# Patient Record
Sex: Male | Born: 1990 | Race: Black or African American | Hispanic: No | Marital: Single | Smoking: Current every day smoker
Health system: Southern US, Community
[De-identification: ages and names within clinical notes are randomized; demographics above are authoritative.]

## PROBLEM LIST (undated history)

## (undated) DIAGNOSIS — F419 Anxiety disorder, unspecified: Secondary | ICD-10-CM

## (undated) DIAGNOSIS — F319 Bipolar disorder, unspecified: Secondary | ICD-10-CM

## (undated) DIAGNOSIS — F39 Unspecified mood [affective] disorder: Secondary | ICD-10-CM

## (undated) DIAGNOSIS — F329 Major depressive disorder, single episode, unspecified: Secondary | ICD-10-CM

## (undated) DIAGNOSIS — F32A Depression, unspecified: Secondary | ICD-10-CM

---
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
## 2012-11-16 ENCOUNTER — Emergency Department (HOSPITAL_COMMUNITY)
Admission: EM | Admit: 2012-11-16 | Discharge: 2012-11-16 | Disposition: A | Discharge status: Home / Self Care | Payer: Self-pay | Attending: Emergency Medicine | Admitting: Emergency Medicine

## 2012-11-16 ENCOUNTER — Encounter (HOSPITAL_COMMUNITY): Payer: Self-pay

## 2012-11-16 DIAGNOSIS — F329 Major depressive disorder, single episode, unspecified: Secondary | ICD-10-CM | POA: Insufficient documentation

## 2012-11-16 DIAGNOSIS — G4700 Insomnia, unspecified: Secondary | ICD-10-CM | POA: Insufficient documentation

## 2012-11-16 DIAGNOSIS — F3289 Other specified depressive episodes: Secondary | ICD-10-CM | POA: Insufficient documentation

## 2012-11-16 DIAGNOSIS — F39 Unspecified mood [affective] disorder: Secondary | ICD-10-CM | POA: Insufficient documentation

## 2012-11-16 LAB — COMPREHENSIVE METABOLIC PANEL
ALT: 21 U/L (ref 0–53)
AST: 19 U/L (ref 0–37)
CO2: 30 mEq/L (ref 19–32)
Chloride: 103 mEq/L (ref 96–112)
GFR calc non Af Amer: >90 mL/min (ref >90)
Sodium: 139 mEq/L (ref 135–145)
Total Bilirubin: 0.2 mg/dL — ABNORMAL LOW (ref 0.3–1.2)

## 2012-11-16 LAB — CBC
Hemoglobin: 14 g/dL (ref 13.0–17.0)
MCV: 93.3 fL (ref 78.0–100.0)
Platelets: 201 10*3/uL (ref 150–400)
RBC: 4.45 MIL/uL (ref 4.22–5.81)
WBC: 6 10*3/uL (ref 4.0–10.5)

## 2012-11-16 LAB — RAPID URINE DRUG SCREEN, HOSP PERFORMED
Amphetamines: NOT DETECTED
Barbiturates: NOT DETECTED
Tetrahydrocannabinol: POSITIVE — AB

## 2012-11-16 MED ORDER — MELATONIN 10 MG PO TABS: 10.0000 mg | ORAL_TABLET | Freq: Every evening | ORAL | Status: DC | PRN

## 2012-11-16 MED ORDER — DIPHENHYDRAMINE HCL 25 MG PO TABS: 25.0000 mg | ORAL_TABLET | Freq: Four times a day (QID) | ORAL | Status: DC

## 2012-11-16 NOTE — ED Notes (Signed)
Pt. States he cannot void at this time.

## 2012-11-16 NOTE — ED Notes (Signed)
Patient presents with vague complaints of insomnia. Reports that he's had "trouble sleeping since he was a kid." Notably, since 2011 while in the [employer]. Reports that he was "kicked out" the [employer] for "not sleeping." Was sent home from work yesterday because he "looked tired." Patient requested to leave work early today because his "brain just checked out." Reports feeling weak and having low energy.  No CP, SOB, dizziness, headaches, fevers, sweats or chills.  Denies any illicit drug use, ETOH or the use of prescription medications that are not prescribed to him. Denies any past medical, surgical or psych hx. Denies SI or HI.

## 2012-11-16 NOTE — ED Provider Notes (Signed)
CSN: [id number]     Arrival date & time 11/16/12  1926 History     First MD Initiated Contact with Patient 11/16/12 2018     Chief Complaint  Patient presents with  . Insomnia   HPI  History provided by the patient. Patient is a 22 year old male with no significant PMH who presents with complaints of difficulty sleeping. Patient states he has had a long history of difficulty sleeping at night. He also states that he feels "as if he has given all his happiness to the world and now he is living without it" patient states that he has had difficulty feeling motivated to go to work or to do normal activities. He still currently is working but he left work early today because he was just not feeling well. He denies any SI or HI. He has never been evaluated for any mood changes. Patient does have family close to he feels are supportive. He denies any other complaints at this time. He has not tried any medications to help him sleep at night. He denies heavy caffeine use. No other aggravating or alleviating factors. No other associated symptoms.    History reviewed. No pertinent past medical history. History reviewed. No pertinent past surgical history. No family history on file. History  Substance Use Topics  . Smoking status: Never Smoker   . Smokeless tobacco: Never Used  . Alcohol Use: No    Review of Systems  Psychiatric/Behavioral: Positive for dysphoric mood. Negative for suicidal ideas and hallucinations. The patient is not nervous/anxious and is not hyperactive.   All other systems reviewed and are negative.    Allergies  Review of patient's allergies indicates no known allergies.  Home Medications  No current outpatient prescriptions on file. BP 132/75  Pulse 64  Temp(Src) 98.6 F (37 C) (Oral)  Resp 16  SpO2 100% Physical Exam  Nursing note and vitals reviewed. Constitutional: He is oriented to person, place, and time. He appears well-developed and well-nourished. No  distress.  HENT:  Head: Normocephalic and atraumatic.  Eyes: Conjunctivae and EOM are normal. Pupils are equal, round, and reactive to light.  Cardiovascular: Normal rate and regular rhythm.   Pulmonary/Chest: Effort normal and breath sounds normal. No respiratory distress. He has no wheezes. He has no rales.  Abdominal: Soft.  Musculoskeletal: Normal range of motion.  Neurological: He is alert and oriented to person, place, and time.  Skin: Skin is warm.  Psychiatric: His behavior is normal. He exhibits a depressed mood.    ED Course   Procedures   Results for orders placed during the hospital encounter of 11/16/12  ACETAMINOPHEN LEVEL      Result Value Range   Acetaminophen (Tylenol), Serum <15.0  10 - 30 ug/mL  CBC      Result Value Range   WBC 6.0  4.0 - 10.5 K/uL   RBC 4.45  4.22 - 5.81 MIL/uL   Hemoglobin 14.0  13.0 - 17.0 g/dL   HCT 40.9  81.1 - 91.4 %   MCV 93.3  78.0 - 100.0 fL   MCH 31.5  26.0 - 34.0 pg   MCHC 33.7  30.0 - 36.0 g/dL   RDW 78.2  95.6 - 21.3 %   Platelets 201  150 - 400 K/uL  COMPREHENSIVE METABOLIC PANEL      Result Value Range   Sodium 139  135 - 145 mEq/L   Potassium 4.2  3.5 - 5.1 mEq/L   Chloride 103  96 -  112 mEq/L   CO2 30  19 - 32 mEq/L   Glucose, Bld 85  70 - 99 mg/dL   BUN 12  6 - 23 mg/dL   Creatinine, Ser 1.61  0.50 - 1.35 mg/dL   Calcium 9.5  8.4 - 09.6 mg/dL   Total Protein 7.3  6.0 - 8.3 g/dL   Albumin 4.2  3.5 - 5.2 g/dL   AST 19  0 - 37 U/L   ALT 21  0 - 53 U/L   Alkaline Phosphatase 59  39 - 117 U/L   Total Bilirubin 0.2 (*) 0.3 - 1.2 mg/dL   GFR calc non Af Amer >90  >90 mL/min   GFR calc Af Amer >90  >90 mL/min  ETHANOL      Result Value Range   Alcohol, Ethyl (B) <11  0 - 11 mg/dL  SALICYLATE LEVEL      Result Value Range   Salicylate Lvl <2.0 (*) 2.8 - 20.0 mg/dL  URINE RAPID DRUG SCREEN (HOSP PERFORMED)      Result Value Range   Opiates NONE DETECTED  NONE DETECTED   Cocaine NONE DETECTED  NONE DETECTED    Benzodiazepines NONE DETECTED  NONE DETECTED   Amphetamines NONE DETECTED  NONE DETECTED   Tetrahydrocannabinol POSITIVE (*) NONE DETECTED   Barbiturates NONE DETECTED  NONE DETECTED      1. Insomnia   2. Depression     MDM  9:00PM patient seen and evaluated. Patient appears well in no acute distress. He denies SI or HI. He is expressing some symptoms consistent with depression. This may be related to his insomnia. This time he is safe to return home without any concerns for harm to himself or others. He will be given resource guide a followup psychiatrist or PCP.  Angus Seller, PA-C 11/16/12 2249

## 2012-11-18 NOTE — ED Provider Notes (Signed)
Medical screening examination/treatment/procedure(s) were performed by non-physician practitioner and as supervising physician I was immediately available for consultation/collaboration.   Mical Kicklighter S Layson Bertsch, MD 11/18/12 1116 

## 2013-02-07 ENCOUNTER — Emergency Department (HOSPITAL_COMMUNITY)
Admission: EM | Admit: 2013-02-07 | Discharge: 2013-02-07 | Disposition: A | Discharge status: Other Acute Inpatient Hospital | Payer: Self-pay | Attending: Emergency Medicine | Admitting: Emergency Medicine

## 2013-02-07 ENCOUNTER — Inpatient Hospital Stay (HOSPITAL_COMMUNITY)
Admission: RE | Admit: 2013-02-07 | Discharge: 2013-02-15 | DRG: 885 | Disposition: A | Discharge status: Home / Self Care | Payer: Federal, State, Local not specified - Other | Attending: Psychiatry | Admitting: Psychiatry

## 2013-02-07 ENCOUNTER — Encounter (HOSPITAL_COMMUNITY): Payer: Self-pay | Admitting: Emergency Medicine

## 2013-02-07 ENCOUNTER — Encounter (HOSPITAL_COMMUNITY): Payer: Self-pay | Admitting: *Deleted

## 2013-02-07 DIAGNOSIS — Z79899 Other long term (current) drug therapy: Secondary | ICD-10-CM

## 2013-02-07 DIAGNOSIS — F39 Unspecified mood [affective] disorder: Secondary | ICD-10-CM | POA: Diagnosis present

## 2013-02-07 DIAGNOSIS — F319 Bipolar disorder, unspecified: Secondary | ICD-10-CM | POA: Diagnosis present

## 2013-02-07 DIAGNOSIS — F22 Delusional disorders: Secondary | ICD-10-CM | POA: Insufficient documentation

## 2013-02-07 HISTORY — DX: Major depressive disorder, single episode, unspecified: F32.9

## 2013-02-07 HISTORY — DX: Anxiety disorder, unspecified: F41.9

## 2013-02-07 HISTORY — DX: Depression, unspecified: F32.A

## 2013-02-07 HISTORY — DX: Unspecified mood (affective) disorder: F39

## 2013-02-07 LAB — CBC
MCH: 31.2 pg (ref 26.0–34.0)
Platelets: 263 10*3/uL (ref 150–400)
RBC: 5.26 MIL/uL (ref 4.22–5.81)
WBC: 7.7 10*3/uL (ref 4.0–10.5)

## 2013-02-07 LAB — COMPREHENSIVE METABOLIC PANEL
Albumin: 4.8 g/dL (ref 3.5–5.2)
Alkaline Phosphatase: 78 U/L (ref 39–117)
BUN: 11 mg/dL (ref 6–23)
CO2: 25 mEq/L (ref 19–32)
Chloride: 96 mEq/L (ref 96–112)
Creatinine, Ser: 0.93 mg/dL (ref 0.50–1.35)
GFR calc Af Amer: >90 mL/min (ref >90)
Glucose, Bld: 108 mg/dL — ABNORMAL HIGH (ref 70–99)
Sodium: 136 mEq/L (ref 135–145)
Total Bilirubin: 0.4 mg/dL (ref 0.3–1.2)
Total Protein: 9.1 g/dL — ABNORMAL HIGH (ref 6.0–8.3)

## 2013-02-07 LAB — RAPID URINE DRUG SCREEN, HOSP PERFORMED
Amphetamines: NOT DETECTED
Benzodiazepines: NOT DETECTED
Opiates: NOT DETECTED

## 2013-02-07 LAB — ETHANOL: Alcohol, Ethyl (B): <11 mg/dL (ref 0–11)

## 2013-02-07 MED ORDER — ACETAMINOPHEN 325 MG PO TABS: 650.0000 mg | ORAL_TABLET | ORAL | Status: DC | PRN

## 2013-02-07 MED ORDER — LORAZEPAM 1 MG PO TABS
1.0000 mg | ORAL_TABLET | Freq: Four times a day (QID) | ORAL | Status: DC | PRN
  Administered 2013-02-07 – 2013-02-09 (×2): 1 mg via ORAL
  Filled 2013-02-07 (×2): qty 1

## 2013-02-07 MED ORDER — ALUM & MAG HYDROXIDE-SIMETH 200-200-20 MG/5ML PO SUSP: 30.0000 mL | ORAL | Status: DC | PRN

## 2013-02-07 MED ORDER — NICOTINE 21 MG/24HR TD PT24: 21.0000 mg | MEDICATED_PATCH | Freq: Every day | TRANSDERMAL | Status: DC

## 2013-02-07 MED ORDER — IBUPROFEN 200 MG PO TABS: 600.0000 mg | ORAL_TABLET | Freq: Three times a day (TID) | ORAL | Status: DC | PRN

## 2013-02-07 MED ORDER — MAGNESIUM HYDROXIDE 400 MG/5ML PO SUSP: 30.0000 mL | Freq: Every day | ORAL | Status: DC | PRN

## 2013-02-07 MED ORDER — OLANZAPINE 5 MG PO TBDP
5.0000 mg | ORAL_TABLET | Freq: Four times a day (QID) | ORAL | Status: DC | PRN
  Administered 2013-02-07 – 2013-02-13 (×3): 5 mg via ORAL
  Filled 2013-02-07 (×3): qty 1

## 2013-02-07 MED ORDER — LORAZEPAM 1 MG PO TABS: 1.0000 mg | ORAL_TABLET | Freq: Three times a day (TID) | ORAL | Status: DC | PRN

## 2013-02-07 MED ORDER — ONDANSETRON HCL 4 MG PO TABS: 4.0000 mg | ORAL_TABLET | Freq: Three times a day (TID) | ORAL | Status: DC | PRN

## 2013-02-07 MED ORDER — ACETAMINOPHEN 325 MG PO TABS
650.0000 mg | ORAL_TABLET | Freq: Four times a day (QID) | ORAL | Status: DC | PRN
  Administered 2013-02-13: 650 mg via ORAL
  Filled 2013-02-07 (×2): qty 2

## 2013-02-07 MED ORDER — ZOLPIDEM TARTRATE 5 MG PO TABS: 5.0000 mg | ORAL_TABLET | Freq: Every evening | ORAL | Status: DC | PRN

## 2013-02-07 NOTE — BH Assessment (Signed)
Writer consulted with Dr. Dub Mikes regarding the patient meeting criteria for inpatient hospitalization.  Dr. Dub Mikes has accepted the patient.  Writer informed the [facility]) that the patient has been accepted.

## 2013-02-07 NOTE — BHH Counselor (Addendum)
Support paperwork signed by pt. Paperwork faxed to [facility] and originals placed in pt's chart.   Evette Cristal, [place] Assessment Counselor   Writer called [facility] [phone] to arrange transportation to [facility]. [name] at [facility] states someone will be at [facility] to transport pt soon.  Evette Cristal, [place] Assessment Counselor

## 2013-02-07 NOTE — Progress Notes (Signed)
Writer received a consult from [facility] ER.  Writer contacted the ER MD and informed them that the consult has been received.    Writer informed the ER MD (Dr. Renae Gloss) that the patient has already been assessed at [facility] and accepted by Dr. Dub Mikes pending a bed.    The [facility]) is aware that the patient has been accepted and is in need of a bed.
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
## 2013-02-07 NOTE — Progress Notes (Signed)
[facility] provided pt with a [employer] Orange Card application, highlighting [facility].  °
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
## 2013-02-07 NOTE — BHH Counselor (Signed)
Per [facility], pt has been accepted by Dr. Dub Mikes to [facility]. Writer notified Fayette Pho NP.   Evette Cristal, [place] Assessment Counselor
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
## 2013-02-07 NOTE — BH Assessment (Addendum)
Writer reviewed the patient with the [facility]) [facility].  Writer contacted [employer] so that the patient can be taken to [employer] for medical clearance.  Writer contacted the TTS counselor Idalia Needle) and the charge nurse Rushie Goltz) regarding the patient being sent to [facility].

## 2013-02-07 NOTE — ED Notes (Signed)
Report given to [facility], pt going to [facility], [facility] has been notified

## 2013-02-08 DIAGNOSIS — F319 Bipolar disorder, unspecified: Secondary | ICD-10-CM

## 2013-02-08 DIAGNOSIS — F39 Unspecified mood [affective] disorder: Secondary | ICD-10-CM | POA: Diagnosis present

## 2013-02-08 DIAGNOSIS — F22 Delusional disorders: Principal | ICD-10-CM

## 2013-02-08 MED ORDER — TRAZODONE HCL 50 MG PO TABS
50.0000 mg | ORAL_TABLET | Freq: Every evening | ORAL | Status: DC | PRN
  Administered 2013-02-09 – 2013-02-14 (×6): 50 mg via ORAL
  Filled 2013-02-08: qty 28
  Filled 2013-02-08 (×6): qty 1

## 2013-02-08 MED ORDER — CITALOPRAM HYDROBROMIDE 10 MG PO TABS
10.0000 mg | ORAL_TABLET | Freq: Every day | ORAL | Status: DC
  Administered 2013-02-09: 10 mg via ORAL
  Filled 2013-02-08 (×2): qty 1

## 2013-02-08 MED ORDER — ENSURE COMPLETE PO LIQD
237.0000 mL | Freq: Two times a day (BID) | ORAL | Status: DC
  Administered 2013-02-08 – 2013-02-15 (×14): 237 mL via ORAL

## 2013-02-08 NOTE — Progress Notes (Signed)
Adult Psychoeducational Group Note  Date:  02/08/2013 Time:  8:57 PM  Group Topic/Focus:  Wrap-Up Group:   The focus of this group is to help patients review their daily goal of treatment and discuss progress on daily workbooks.  Participation Level:  Active  Participation Quality:  Appropriate  Affect:  Appropriate  Cognitive:  Appropriate  Insight: Appropriate  Engagement in Group:  Engaged  Modes of Intervention:  Discussion  Additional Comments:The patient expressed that his day was great because he has faith in God.The patient said that more activities would make his day better because he would have more to do.  Octavio Manns 02/08/2013, 8:57 PM
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
## 2013-02-08 NOTE — Tx Team (Signed)
Initial Interdisciplinary Treatment Plan  PATIENT STRENGTHS: (choose at least two) Average or above average intelligence Communication skills Physical Health Supportive family/friends  PATIENT STRESSORS: Educational concerns Financial difficulties Legal issue Marital or family conflict Substance abuse   PROBLEM LIST: Problem List/Patient Goals Date to be addressed Date deferred Reason deferred Estimated date of resolution  Psychosis 11.3.2014     Family conflict 11.3.2014     Legal issue 11.3.2014                                          DISCHARGE CRITERIA:  Ability to meet basic life and health needs Improved stabilization in mood, thinking, and/or behavior Motivation to continue treatment in a less acute level of care Verbal commitment to aftercare and medication compliance  PRELIMINARY DISCHARGE PLAN: Return to previous living arrangement Return to previous work or school arrangements  PATIENT/FAMIILY INVOLVEMENT: This treatment plan has been presented to and reviewed with the patient, Paul Brown.  The patient and family have been given the opportunity to ask questions and make suggestions.  Cranford Mon 02/08/2013, 7:08 AM

## 2013-02-08 NOTE — ED Provider Notes (Signed)
Medical screening examination/treatment/procedure(s) were performed by non-physician practitioner and as supervising physician I was immediately available for consultation/collaboration.  EKG Interpretation   None         Gwyneth Sprout, MD 02/08/13 [phone]

## 2013-02-08 NOTE — [facility] Group Notes (Signed)
[facility] LCSW Group Therapy  02/08/2013 , 10:42 AM   Type of Therapy:  Group Therapy  Participation Level:  Active  Participation Quality:  Attentive  Affect:  Appropriate  Cognitive:  Alert  Insight:  Improving  Engagement in Therapy:  Engaged  Modes of Intervention:  Discussion, Exploration and Socialization  Summary of Progress/Problems: Today's group focused on the term Diagnosis.  Participants were asked to define the term, and then pronounce whether it is a negative, positive or neutral term.  Cynthia defined diagnosis as an educated guess, or hypothesis.  He was in and out of group a time or two, but was engaged while there.  He joined the discussion about not allowing one's diagnosis to define them, and added that one just needs enough self confidence and support to be able to ignore the hurtful comments of others.  [name] B 02/08/2013 , 10:42 AM

## 2013-02-08 NOTE — Progress Notes (Signed)
Patient ID: Paul Brown, male   DOB: 11-Sep-1990, 22 y.o.   MRN: [id number] Pt was resting in bed alone.  Denies needs/concerns.  Pt denied SI, HI and AVH.  Pt presenting upbeat mood and stated he was thinking about the "man above".  Pt was pleasant and cooperative but did not engage in much conversation.  Support and encouragement given.  Fifteen minute checks in progress. Pt safe on unit.
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
## 2013-02-08 NOTE — Progress Notes (Signed)
Patient ID: Paul Brown, male   DOB: 05/31/1990, 22 y.o.   MRN: [id number]   Pt laying in bed resting with eyes closed. Respirations even and unlabored. No distress noted.  

## 2013-02-08 NOTE — Progress Notes (Signed)
NUTRITION ASSESSMENT  Pt identified as at risk on the Malnutrition Screen Tool  INTERVENTION: 1. Educated patient on the importance of nutrition and encouraged intake of food and beverages. 2. Supplements: Ensure Complete BID  NUTRITION DIAGNOSIS: Increased nutrient needs related to excess exercise as evidenced by pt report.    Goal: Pt to meet >/= 90% of their estimated nutrition needs.  Monitor:  PO intake  Assessment:  Pt admitted with being paranoid for the past 6 months. Met with pt who reports eating well PTA, 3 meals/day, however has been exercising more than he has been eating. States he walks a lot. Thinks when he gets better, he will walk less, and then his weight will get back to normal. Weighs 108 pounds. Feels like he has been losing muscle mass recently. Interested in getting Ensure Complete during admission.   22 y.o. male  Height: Ht Readings from Last 1 Encounters:  02/07/13 5\' 5"  (1.651 m)    Weight: Wt Readings from Last 1 Encounters:  02/07/13 108 lb (48.988 kg)    Weight Hx: Wt Readings from Last 10 Encounters:  02/07/13 108 lb (48.988 kg)    BMI:  Body mass index is 17.97 kg/(m^2). Pt meets criteria for underweight based on current BMI.  Estimated Nutritional Needs: Kcal: 1500-1700 Protein: 75-95g Fluid: 1.5-1.7L/day  Diet Order: General Pt is also offered choice of unit snacks mid-morning and mid-afternoon.  Pt is eating as desired.   Lab results and medications reviewed.   Levon Hedger MS, RD, LDN [phone] Pager [phone] After Hours Pager

## 2013-02-08 NOTE — Progress Notes (Signed)
Patient ID: Paul Brown, male   DOB: 02/12/1991, 22 y.o.   MRN: [id number]   Pt laying in bed resting with eyes closed. Respirations even and unlabored. No distress noted.  

## 2013-02-08 NOTE — [facility] Suicide Risk Assessment (Signed)
Suicide Risk Assessment  Admission Assessment     Nursing information obtained from:    Demographic factors:    Current Mental Status:    Loss Factors:    Historical Factors:    Risk Reduction Factors:     CLINICAL FACTORS:   Severe Anxiety and/or Agitation Depression:   Aggression Hopelessness Impulsivity Insomnia  COGNITIVE FEATURES THAT CONTRIBUTE TO RISK:  Closed-mindedness    SUICIDE RISK:   Minimal: No identifiable suicidal ideation.  Patients presenting with no risk factors but with morbid ruminations; may be classified as minimal risk based on the severity of the depressive symptoms  PLAN OF CARE:1. Admit for crisis management and stabilization. 2. Medication management to reduce current symptoms to base line and improve the     patient's overall level of functioning 3. Treat health problems as indicated. 4. Develop treatment plan to decrease risk of relapse upon discharge and the need for     readmission. 5. Psycho-social education regarding relapse prevention and self care. 6. Health care follow up as needed for medical problems. 7. Restart home medications where appropriate.   I certify that inpatient services furnished can reasonably be expected to improve the patient's condition.  Thedore Mins, MD 02/08/2013, 11:35 AM

## 2013-02-09 DIAGNOSIS — F22 Delusional disorders: Secondary | ICD-10-CM | POA: Diagnosis present

## 2013-02-09 DIAGNOSIS — F319 Bipolar disorder, unspecified: Secondary | ICD-10-CM | POA: Diagnosis present

## 2013-02-09 MED ORDER — OLANZAPINE 5 MG PO TBDP
5.0000 mg | ORAL_TABLET | Freq: Every day | ORAL | Status: DC
  Administered 2013-02-09 – 2013-02-12 (×4): 5 mg via ORAL
  Filled 2013-02-09 (×8): qty 1

## 2013-02-09 MED ORDER — DIVALPROEX SODIUM 250 MG PO DR TAB
250.0000 mg | DELAYED_RELEASE_TABLET | Freq: Two times a day (BID) | ORAL | Status: DC
  Administered 2013-02-10 – 2013-02-12 (×5): 250 mg via ORAL
  Filled 2013-02-09 (×11): qty 1

## 2013-02-09 NOTE — Progress Notes (Signed)
[facility] MD Progress Note  02/09/2013 11:04 AM Paul Brown  MRN:  [id number] Subjective: " My mother has no right to put me in the hospital. I am ready to tell her now that I will get a basket ball scholarship and go to college of my choice." Objective: Patient with no insight into his problem who has been exercising poor judgment as evidenced by his refusal to take medications, comply with the unit rules and making  911 phone calls saying that his mother has no right to put him in the hospital. Patient has been acting bizarre with disorganized thinking. He has a fixed paranoid that some people who are out to get him have been monitoring his activities through his phone and on Instagram. He is grandiose, delusional, irritable and gets angry easily. He has been refusing to comply with his medications. Diagnosis:   DSM5: Schizophrenia Disorders:  Delusional Disorder (297.1) Obsessive-Compulsive Disorders:   Trauma-Stressor Disorders:   Substance/Addictive Disorders:   Depressive Disorders:  Disruptive Mood Dysregulation Disorder (296.99)  Axis I: Unspecified Bipolar disorder           Delusional disorder, NOS  ADL's:  Intact  Sleep: Poor  Appetite:  Fair  Suicidal Ideation: Denies  Homicidal Ideation: Denies  AEB (as evidenced by):  Psychiatric Specialty Exam: Review of Systems  Constitutional: Negative.   HENT: Negative.   Eyes: Negative.   Respiratory: Negative.   Cardiovascular: Negative.   Gastrointestinal: Negative.   Genitourinary: Negative.   Musculoskeletal: Negative.   Skin: Negative.   Neurological: Negative.   Endo/Heme/Allergies: Negative.   Psychiatric/Behavioral: The patient is nervous/anxious.     Blood pressure 133/81, pulse 93, temperature 97.7 F (36.5 C), temperature source Oral, resp. rate 20, height 5\' 5"  (1.651 m), weight 48.988 kg (108 lb).Body mass index is 17.97 kg/(m^2).  General Appearance: Fairly Groomed  Patent attorney::  Minimal  Speech:   Pressured  Volume:  Increased  Mood:  Angry and Irritable  Affect:  Labile and Full Range  Thought Process:  Circumstantial  Orientation:  Full (Time, Place, and Person)  Thought Content:  Delusions  Suicidal Thoughts:  No  Homicidal Thoughts:  No  Memory:  Immediate;   Fair Recent;   Fair Remote;   Fair  Judgement:  Poor  Insight:  Lacking  Psychomotor Activity:  Increased  Concentration:  Poor  Recall:  Fair  Akathisia:  No  Handed:  Right  AIMS (if indicated):     Assets:  Physical Health  Sleep:  Number of Hours: 4.75   Current Medications: Current Facility-Administered Medications  Medication Dose Route Frequency Provider Last Rate Last Dose  . acetaminophen (TYLENOL) tablet 650 mg  650 mg Oral Q6H PRN Rachael Fee, MD      . alum & mag hydroxide-simeth (MAALOX/MYLANTA) 200-200-20 MG/5ML suspension 30 mL  30 mL Oral Q4H PRN Rachael Fee, MD      . divalproex (DEPAKOTE) DR tablet 250 mg  250 mg Oral BID PC Cobin Cadavid      . feeding supplement (ENSURE COMPLETE) (ENSURE COMPLETE) liquid 237 mL  237 mL Oral BID BM Lavena Bullion, RD   237 mL at 02/08/13 1956  . LORazepam (ATIVAN) tablet 1 mg  1 mg Oral Q6H PRN Rachael Fee, MD   1 mg at 02/07/13 1504  . magnesium hydroxide (MILK OF MAGNESIA) suspension 30 mL  30 mL Oral Daily PRN Rachael Fee, MD      . OLANZapine zydis [facility])  disintegrating tablet 5 mg  5 mg Oral Q6H PRN Rachael Fee, MD   5 mg at 02/07/13 1504  . OLANZapine zydis (ZYPREXA) disintegrating tablet 5 mg  5 mg Oral QHS Mechele Kittleson      . traZODone (DESYREL) tablet 50 mg  50 mg Oral QHS PRN,MR X 1 Fransisca Kaufmann, NP        Lab Results:  Results for orders placed during the hospital encounter of 02/07/13 (from the past 48 hour(s))  CBC     Status: None   Collection Time    02/07/13 11:24 AM      Result Value Range   WBC 7.7  4.0 - 10.5 K/uL   RBC 5.26  4.22 - 5.81 MIL/uL   Hemoglobin 16.4  13.0 - 17.0 g/dL   HCT 16.1  09.6 - 04.5 %   MCV 91.3   78.0 - 100.0 fL   MCH 31.2  26.0 - 34.0 pg   MCHC 34.2  30.0 - 36.0 g/dL   RDW 40.9  81.1 - 91.4 %   Platelets 263  150 - 400 K/uL  COMPREHENSIVE METABOLIC PANEL     Status: Abnormal   Collection Time    02/07/13 11:24 AM      Result Value Range   Sodium 136  135 - 145 mEq/L   Potassium 3.5  3.5 - 5.1 mEq/L   Chloride 96  96 - 112 mEq/L   CO2 25  19 - 32 mEq/L   Glucose, Bld 108 (*) 70 - 99 mg/dL   BUN 11  6 - 23 mg/dL   Creatinine, Ser 7.82  0.50 - 1.35 mg/dL   Calcium 95.6 (*) 8.4 - 10.5 mg/dL   Total Protein 9.1 (*) 6.0 - 8.3 g/dL   Albumin 4.8  3.5 - 5.2 g/dL   AST 30  0 - 37 U/L   ALT 25  0 - 53 U/L   Alkaline Phosphatase 78  39 - 117 U/L   Total Bilirubin 0.4  0.3 - 1.2 mg/dL   GFR calc non Af Amer >90  >90 mL/min   GFR calc Af Amer >90  >90 mL/min   Comment: (NOTE)     The eGFR has been calculated using the CKD EPI equation.     This calculation has not been validated in all clinical situations.     eGFR's persistently <90 mL/min signify possible Chronic Kidney     Disease.  ETHANOL     Status: None   Collection Time    02/07/13 11:24 AM      Result Value Range   Alcohol, Ethyl (B) <11  0 - 11 mg/dL   Comment:            LOWEST DETECTABLE LIMIT FOR     SERUM ALCOHOL IS 11 mg/dL     FOR MEDICAL PURPOSES ONLY  URINE RAPID DRUG SCREEN (HOSP PERFORMED)     Status: None   Collection Time    02/07/13 11:40 AM      Result Value Range   Opiates NONE DETECTED  NONE DETECTED   Cocaine NONE DETECTED  NONE DETECTED   Benzodiazepines NONE DETECTED  NONE DETECTED   Amphetamines NONE DETECTED  NONE DETECTED   Tetrahydrocannabinol NONE DETECTED  NONE DETECTED   Barbiturates NONE DETECTED  NONE DETECTED   Comment:            DRUG SCREEN FOR MEDICAL PURPOSES     ONLY.  IF CONFIRMATION IS  NEEDED     FOR ANY PURPOSE, NOTIFY LAB     WITHIN 5 DAYS.                LOWEST DETECTABLE LIMITS     FOR URINE DRUG SCREEN     Drug Class       Cutoff (ng/mL)     Amphetamine       1000     Barbiturate      200     Benzodiazepine   200     Tricyclics       300     Opiates          300     Cocaine          300     THC              50    Physical Findings: AIMS: Facial and Oral Movements Muscles of Facial Expression: None, normal Lips and Perioral Area: None, normal Jaw: None, normal Tongue: None, normal,Extremity Movements Upper (arms, wrists, hands, fingers): None, normal Lower (legs, knees, ankles, toes): None, normal, Trunk Movements Neck, shoulders, hips: None, normal, Overall Severity Severity of abnormal movements (highest score from questions above): None, normal Incapacitation due to abnormal movements: None, normal Patient's awareness of abnormal movements (rate only patient's report): No Awareness, Dental Status Current problems with teeth and/or dentures?: No Does patient usually wear dentures?: No  CIWA:    COWS:     Treatment Plan Summary: Daily contact with patient to assess and evaluate symptoms and progress in treatment Medication management  Plan:1. Admit for crisis management and stabilization. 2. Medication management to reduce current symptoms to base line and improve the     patient's overall level of functioning 3. Treat health problems as indicated. 4. Develop treatment plan to decrease risk of relapse upon discharge and the need for     readmission. 5. Psycho-social education regarding relapse prevention and self care. 6. Health care follow up as needed for medical problems. 7. Restart home medications where appropriate.   Medical Decision Making Problem Points:  Established problem, worsening (2), Review of last therapy session (1) and Review of psycho-social stressors (1) Data Points:  Order Aims Assessment (2) Review of medication regiment & side effects (2) Review of new medications or change in dosage (2)  I certify that inpatient services furnished can reasonably be expected to improve the patient's condition.    Thedore Mins, MD 02/09/2013, 11:04 AM

## 2013-02-09 NOTE — Progress Notes (Signed)
Adult Services Patient-Family Contact/Session  Attendees:    Goal(s):    Safety Concerns:    Narrative:    Barrier(s):    Interventions:  Spoke to mother by phone.  She had come this AM to pick pt up after he called to say he was being released.  Advised her to contact me or nursing staff if he calls again so she can verify.  Went over meds with her and prognosis [poor.]  Asked her if she would be OK with forced meds if it came to that, and she concurred.  Recommendation(s):    Follow-up Required:  No  Explanation:    Ida Rogue 02/09/2013, 5:40 PM

## 2013-02-09 NOTE — Progress Notes (Signed)
Recreation Therapy Notes  Date: 11.05.2014 Time: 9:30am Location: [address] Dayroom  Group Topic: Communication  Goal Area(s) Addresses:  Patient will effectively communicate with peers in group.  Patient will verbalize benefit of healthy communication. Patient will verbalize positive effect of healthy communication on post d/c goals.   Behavioral Response: Did not attend.   Marykay Lex Cova Knieriem, LRT/CTRS  Lurleen Soltero L 02/09/2013 1:11 PM

## 2013-02-09 NOTE — Progress Notes (Signed)
Patient ID: Paul Brown, male   DOB: [birth year], 22 y.o.   MRN: [id number]   Pt laying in bed resting with eyes closed. Respirations even and unlabored. No distress noted.

## 2013-02-09 NOTE — Progress Notes (Signed)
D: Patient denies SI/HI and A/V hallucinations; patient reports sleep is well; reports appetite is good; reports energy level is hyper ; reports ability to pay attention is improving; rates depression as 0/10; rates hopelessness 0/10; patient reports that he has no depression and hopelessness and states " Im not depressed or hopeless, Im Blessed"  A: Monitored q 15 minutes; patient encouraged to attend groups; patient educated about medications; patient given medications per physician orders; patient encouraged to express feelings and/or concerns  R: Patient constantly talking about his rights and says that he is a [place] citizens and that he name was forged; patient states that he wants to leave because he wants to go to college; patient is religiously preoccupied, grandiose, and sometimes is having random conversations; with patient's interaction with staff and peers is inappropriate and can be redirected after several attempts; patient refuses to take certain medications because he states " Im not taking narcotics"  patient did take his citalopram this morning but he refused the by mouth Zyprexa; patient is not attending any groups and patient reports " Im not dysfunctional so I don't need to go"

## 2013-02-09 NOTE — BHH Group Notes (Signed)
[facility] LCSW Aftercare Discharge Planning Group Note   02/09/2013 9:41 AM  Participation Quality:  Paul Brown opened the group door after we had all assembled and announced he was leaving.  Of course that sent several members into a tizzy, and the process came to a grinding halt.  I told him he was not leaving and refocused the group.  He came back, and began laughing at another patient's comments.  I dismissed him.  He left without incident.    [name] B

## 2013-02-09 NOTE — Progress Notes (Signed)
Patient ID: Paul Brown, male   DOB: 1991/02/03, 22 y.o.   MRN: [id number]  D: Pt informed the writer that he'd had a "meltdown". Stated, "I freaked out yo".  Stated that once he calmed down, he realized what he'd done. Stated, "that meltdown was out of character".  Pt stated he's ready for discharge. Plans to go to school and participate in school athletics.   A:  Support and encouragement was offered. 15 min checks continued for safety.  R: Pt remains safe.

## 2013-02-09 NOTE — BHH Group Notes (Signed)
[facility] Group Therapy  02/09/2013 , 1:26 PM    Type of Therapy:  [employer] Presentation  Participation Level:  Did not attend  Summary of Progress/Problems:  Onalee Hua from [employer] came to present his recovery story and play the guitar.    [name] B 02/09/2013 , 1:26 PM

## 2013-02-10 NOTE — Progress Notes (Signed)
Adult Psychoeducational Group Note  Date:  02/10/2013 Time:  8:00PM Group Topic/Focus:  Wrap-Up Group:   The focus of this group is to help patients review their daily goal of treatment and discuss progress on daily workbooks.  Participation Level:  Active  Participation Quality:  Appropriate and Attentive  Affect:  Appropriate  Cognitive:  Alert and Appropriate  Insight: Appropriate  Engagement in Group:  Engaged  Modes of Intervention:  Discussion  Additional Comments:  Pt. Was attentive and appropriate during karaoke.    Paul Brown 02/10/2013, 10:09 PM

## 2013-02-10 NOTE — Progress Notes (Signed)
Patient ID: Paul Brown, male   DOB: July 12, 1990, 22 y.o.   MRN: [id number] D: patient met with treatment team and expressed his disappointment in his mother.  Patient stated, "She is holding me back from who I want to be."  Patient expressed his desire to go "[employer]" and play "[employer] one basketball."  Patient states that mom want him to go to "[employer]", however, he wants to finish his college education.  He reports that he went to [employer] for two years and was put on academic probation because of his grades.  He has recently been attending [employer].  Patient stated, "I'm going to put in a transfer for [employer]  Patient presented with bright affect.  He was joking during meeting with doctor, and at times acting goofy.  Patient has poor insight and judgement.  He denies any SI/HI/AVH.  A:  Continue to encourage and support patient.  Redirect when inappropriate behavior is exhibited.  Safety checks completed every 15 minutes per protocol.  R:  Patient's behavior has been appropriate.

## 2013-02-10 NOTE — B[facility] Group Notes (Signed)
[facility] Group Notes:  (Counselor/Nursing/MHT/Case Management/Adjunct)  02/10/2013 1:15PM  Type of Therapy:  Group Therapy  Participation Level:  Active  Participation Quality:  Appropriate  Affect:  Flat  Cognitive:  Oriented  Insight:  Improving  Engagement in Group:  Limited  Engagement in Therapy:  Limited  Modes of Intervention:  Discussion, Exploration and Socialization  Summary of Progress/Problems: The topic for group was balance in life.  Pt participated in the discussion about when their life was in balance and out of balance and how this feels.  Pt discussed ways to get back in balance and short term goals they can work on to get where they want to be.  Paul Brown shared that he is balanced, and knows this because he feels good today.  To help make himself feel good, he has been doing exercises here, eating well and laughing.  He also likes to walk outside.  He also talked about the relief he experiences from getting and being organized.  When everything is in order, he is in a better place mentally and emotionally.  We used his examples to talk about self-discipline as a group.   Paul Brown B 02/10/2013 3:06 PM

## 2013-02-10 NOTE — Progress Notes (Signed)
Patient ID: Paul Brown, male   DOB: 12/19/1990, 22 y.o.   MRN: [id number] [facility] MD Progress Note  02/10/2013 10:26 AM Paul Brown  MRN:  [id number] Subjective: " I keep getting upset because my mama does not want me to succeed, she wants me to go to [employer] instead of going to college, I want to be a successful educated young African American." Objective: Patient with decreased mood swings, agitation and irritability.  His thinking process is more organized today, however, he remains grandiose and paranoid. He started taking medication last night and denies any adverse reactions Diagnosis:   DSM5: Schizophrenia Disorders:  Delusional Disorder (297.1) Obsessive-Compulsive Disorders:   Trauma-Stressor Disorders:   Substance/Addictive Disorders:   Depressive Disorders:  Disruptive Mood Dysregulation Disorder (296.99)  Axis I: Unspecified Bipolar disorder           Delusional disorder, NOS  ADL's:  Intact  Sleep: fair  Appetite:  Fair  Suicidal Ideation: Denies  Homicidal Ideation: Denies  AEB (as evidenced by):  Psychiatric Specialty Exam: Review of Systems  Constitutional: Negative.   HENT: Negative.   Eyes: Negative.   Respiratory: Negative.   Cardiovascular: Negative.   Gastrointestinal: Negative.   Genitourinary: Negative.   Musculoskeletal: Negative.   Skin: Negative.   Neurological: Negative.   Endo/Heme/Allergies: Negative.   Psychiatric/Behavioral: The patient is nervous/anxious.     Blood pressure 131/91, pulse 137, temperature 97.6 F (36.4 C), temperature source Oral, resp. rate 20, height 5\' 5"  (1.651 m), weight 48.988 kg (108 lb).Body mass index is 17.97 kg/(m^2).  General Appearance: Fairly Groomed  Patent attorney::  Minimal  Speech:  Pressured  Volume:  Increased  Mood:  Angry and Irritable  Affect:  Labile and Full Range  Thought Process:  Circumstantial  Orientation:  Full (Time, Place, and Person)  Thought Content:  Delusions  Suicidal  Thoughts:  No  Homicidal Thoughts:  No  Memory:  Immediate;   Fair Recent;   Fair Remote;   Fair  Judgement:  Poor  Insight:  Lacking  Psychomotor Activity:  Increased  Concentration:  Poor  Recall:  Fair  Akathisia:  No  Handed:  Right  AIMS (if indicated):     Assets:  Physical Health  Sleep:  Number of Hours: 5   Current Medications: Current Facility-Administered Medications  Medication Dose Route Frequency Provider Last Rate Last Dose  . acetaminophen (TYLENOL) tablet 650 mg  650 mg Oral Q6H PRN Rachael Fee, MD      . alum & mag hydroxide-simeth (MAALOX/MYLANTA) 200-200-20 MG/5ML suspension 30 mL  30 mL Oral Q4H PRN Rachael Fee, MD      . divalproex (DEPAKOTE) DR tablet 250 mg  250 mg Oral BID PC Yesika Rispoli   250 mg at 02/10/13 0803  . feeding supplement (ENSURE COMPLETE) (ENSURE COMPLETE) liquid 237 mL  237 mL Oral BID BM Lavena Bullion, RD   237 mL at 02/10/13 0804  . LORazepam (ATIVAN) tablet 1 mg  1 mg Oral Q6H PRN Rachael Fee, MD   1 mg at 02/09/13 2351  . magnesium hydroxide (MILK OF MAGNESIA) suspension 30 mL  30 mL Oral Daily PRN Rachael Fee, MD      . OLANZapine zydis (ZYPREXA) disintegrating tablet 5 mg  5 mg Oral Q6H PRN Rachael Fee, MD   5 mg at 02/07/13 1504  . OLANZapine zydis (ZYPREXA) disintegrating tablet 5 mg  5 mg Oral QHS Gay Moncivais   5 mg  at 02/09/13 2225  . traZODone (DESYREL) tablet 50 mg  50 mg Oral QHS PRN,MR X 1 Fransisca Kaufmann, NP   50 mg at 02/09/13 2351    Lab Results:  No results found for this or any previous visit (from the past 48 hour(s)).  Physical Findings: AIMS: Facial and Oral Movements Muscles of Facial Expression: None, normal Lips and Perioral Area: None, normal Jaw: None, normal Tongue: None, normal,Extremity Movements Upper (arms, wrists, hands, fingers): None, normal Lower (legs, knees, ankles, toes): None, normal, Trunk Movements Neck, shoulders, hips: None, normal, Overall Severity Severity of abnormal  movements (highest score from questions above): None, normal Incapacitation due to abnormal movements: None, normal Patient's awareness of abnormal movements (rate only patient's report): No Awareness, Dental Status Current problems with teeth and/or dentures?: No Does patient usually wear dentures?: No  CIWA:    COWS:     Treatment Plan Summary: Daily contact with patient to assess and evaluate symptoms and progress in treatment Medication management  Plan:1. Admit for crisis management and stabilization. 2. Medication management to reduce current symptoms to base line and improve the  patient's overall level of functioning:     Depakote 250mg  po bid for mood lability and Zyprexa 5mg  po Qhs for delusions. 3. Treat health problems as indicated. 4. Develop treatment plan to decrease risk of relapse upon discharge and the need for     readmission. 5. Psycho-social education regarding relapse prevention and self care. 6. Health care follow up as needed for medical problems.  Medical Decision Making Problem Points:  Established problem, slight improvement (1), Review of last therapy session (1) and Review of psycho-social stressors (1) Data Points:  Order Aims Assessment (2) Review of medication regiment & side effects (2) Review of new medications or change in dosage (2)  I certify that inpatient services furnished can reasonably be expected to improve the patient's condition.   Thedore Mins, MD 02/10/2013, 10:26 AM

## 2013-02-11 NOTE — Progress Notes (Signed)
D: Pt denies SI/HI/AVH . Pt is pleasant and cooperative. Pt continues to be delusional, guarded , and has no insight to his situation. Pt stated he was going to play sports for [employer], writer explained to the pt that he needs to understand what things are achievable and what things are not. Writer told pt that with his current size, age and not playing in high school that his chances were highly unlikely to play sports at [employer] and pt needs to focus on more attainable goals.   A: Pt was offered support and encouragement. Pt was given scheduled medications. Pt was encourage to attend groups. Q 15 minute checks were done for safety.   R:Pt attends groups and interacts well with peers and staff. Pt is taking medication.Pt receptive to treatment and safety maintained on unit.

## 2013-02-11 NOTE — Progress Notes (Signed)
Patient ID: Paul Brown, male   DOB: Jul 22, 1990, 22 y.o.   MRN: [id number] [facility] MD Progress Note  02/11/2013 10:19 AM Paul Brown  MRN:  [id number] Subjective: " I am less irritable or agitated today."   Objective: Patient reports decreased anxiety, irritability and mood lability. He has been acting less bizarre and cooperating with the staffs. But,  he remains grandiose and paranoid. He has been compliant with his medications and has not endorsed any any adverse reactions.  Diagnosis:   DSM5: Schizophrenia Disorders:  Delusional Disorder (297.1) Obsessive-Compulsive Disorders:   Trauma-Stressor Disorders:   Substance/Addictive Disorders:   Depressive Disorders:  Disruptive Mood Dysregulation Disorder (296.99)  Axis I: Unspecified Bipolar disorder           Delusional disorder, NOS  ADL's:  Intact  Sleep: fair  Appetite:  Fair  Suicidal Ideation: Denies  Homicidal Ideation: Denies  AEB (as evidenced by):  Psychiatric Specialty Exam: Review of Systems  Constitutional: Negative.   HENT: Negative.   Eyes: Negative.   Respiratory: Negative.   Cardiovascular: Negative.   Gastrointestinal: Negative.   Genitourinary: Negative.   Musculoskeletal: Negative.   Skin: Negative.   Neurological: Negative.   Endo/Heme/Allergies: Negative.   Psychiatric/Behavioral: The patient is nervous/anxious.     Blood pressure 131/91, pulse 137, temperature 97.6 F (36.4 C), temperature source Oral, resp. rate 20, height 5\' 5"  (1.651 m), weight 48.988 kg (108 lb).Body mass index is 17.97 kg/(m^2).  General Appearance: Fairly Groomed  Patent attorney::  Minimal  Speech:  Pressured  Volume:  Increased  Mood:  Angry and Irritable  Affect:  Labile and Full Range  Thought Process:  Circumstantial  Orientation:  Full (Time, Place, and Person)  Thought Content:  Delusions  Suicidal Thoughts:  No  Homicidal Thoughts:  No  Memory:  Immediate;   Fair Recent;   Fair Remote;   Fair  Judgement:   Poor  Insight:  Lacking  Psychomotor Activity:  Increased  Concentration:  Poor  Recall:  Fair  Akathisia:  No  Handed:  Right  AIMS (if indicated):     Assets:  Physical Health  Sleep:  Number of Hours: 6.5   Current Medications: Current Facility-Administered Medications  Medication Dose Route Frequency Provider Last Rate Last Dose  . acetaminophen (TYLENOL) tablet 650 mg  650 mg Oral Q6H PRN Rachael Fee, MD      . alum & mag hydroxide-simeth (MAALOX/MYLANTA) 200-200-20 MG/5ML suspension 30 mL  30 mL Oral Q4H PRN Rachael Fee, MD      . divalproex (DEPAKOTE) DR tablet 250 mg  250 mg Oral BID PC Peggy Loge   250 mg at 02/11/13 0800  . feeding supplement (ENSURE COMPLETE) (ENSURE COMPLETE) liquid 237 mL  237 mL Oral BID BM Lavena Bullion, RD   237 mL at 02/10/13 2132  . LORazepam (ATIVAN) tablet 1 mg  1 mg Oral Q6H PRN Rachael Fee, MD   1 mg at 02/09/13 2351  . magnesium hydroxide (MILK OF MAGNESIA) suspension 30 mL  30 mL Oral Daily PRN Rachael Fee, MD      . OLANZapine zydis (ZYPREXA) disintegrating tablet 5 mg  5 mg Oral Q6H PRN Rachael Fee, MD   5 mg at 02/07/13 1504  . OLANZapine zydis (ZYPREXA) disintegrating tablet 5 mg  5 mg Oral QHS Elmore Hyslop   5 mg at 02/10/13 2132  . traZODone (DESYREL) tablet 50 mg  50 mg Oral QHS PRN,MR X 1  Fransisca Kaufmann, NP   50 mg at 02/10/13 2132    Lab Results:  No results found for this or any previous visit (from the past 48 hour(s)).  Physical Findings: AIMS: Facial and Oral Movements Muscles of Facial Expression: None, normal Lips and Perioral Area: None, normal Jaw: None, normal Tongue: None, normal,Extremity Movements Upper (arms, wrists, hands, fingers): None, normal Lower (legs, knees, ankles, toes): None, normal, Trunk Movements Neck, shoulders, hips: None, normal, Overall Severity Severity of abnormal movements (highest score from questions above): None, normal Incapacitation due to abnormal movements: None,  normal Patient's awareness of abnormal movements (rate only patient's report): No Awareness, Dental Status Current problems with teeth and/or dentures?: No Does patient usually wear dentures?: No  CIWA:    COWS:     Treatment Plan Summary: Daily contact with patient to assess and evaluate symptoms and progress in treatment Medication management  Plan:1. Admit for crisis management and stabilization. 2. Medication management to reduce current symptoms to base line and improve the  patient's overall level of functioning:     Continue  Depakote 250mg  po bid for mood lability and Zyprexa 5mg  po Qhs for delusions. 3. Treat health problems as indicated. 4. Develop treatment plan to decrease risk of relapse upon discharge and the need for     readmission. 5. Psycho-social education regarding relapse prevention and self care. 6. Health care follow up as needed for medical problems.  Medical Decision Making Problem Points:  Established problem, slight improvement (1), Review of last therapy session (1) and Review of psycho-social stressors (1) Data Points:  Order Aims Assessment (2) Review of medication regiment & side effects (2) Review of new medications or change in dosage (2)  I certify that inpatient services furnished can reasonably be expected to improve the patient's condition.   Thedore Mins, MD 02/11/2013, 10:19 AM

## 2013-02-11 NOTE — [facility] Group Notes (Signed)
[facility] LCSW Aftercare Discharge Planning Group Note   02/11/2013 3:44 PM  Participation Quality:  Active     Mood/Affect:  Appropriate  Depression Rating:  1  Anxiety Rating:  Denies  Thoughts of Suicide:  No Will you contract for safety?   NA  Current AVH:  No  Plan for Discharge/Comments:  Tejas presented with congruent thoughts and improved insight.  [place] talked to his grandfather yesterday, and with his grandfather's advice, Janes stated that he needs to follow the doctor's order and take his medication.  He stated "I realized that you're the one that has the certification.  I don't."     Transportation Means: family  Supports:  Family   Simona Huh

## 2013-02-11 NOTE — Tx Team (Signed)
  Interdisciplinary Treatment Plan Update   Date Reviewed:  02/11/2013  Time Reviewed:  3:39 PM  Progress in Treatment:   Attending groups: Yes Participating in groups: Yes Taking medication as prescribed: Yes  Tolerating medication: Yes Family/Significant other contact made: Yes  Patient understands diagnosis: Yes  Discussing patient identified problems/goals with staff: Yes Medical problems stabilized or resolved: Yes Denies suicidal/homicidal ideation: Yes Patient has not harmed self or others: Yes  For review of initial/current patient goals, please see plan of care.  Estimated Length of Stay:  3-5 days  Reason for Continuation of Hospitalization: Medication stabilization Other; describe Mood lability  New Problems/Goals identified:  N/A  Discharge Plan or Barriers:   return home, follow up outpt  Additional Comments:  Paul Brown has been progressing since he was willing to take medication starting yesterday.   He apologized for past outbursts and has been participating in the milieu.  Attendees:  Signature: Thedore Mins, MD 02/11/2013 3:39 PM   Signature: Richelle Ito, LCSW 02/11/2013 3:39 PM  Signature: Fransisca Kaufmann, NP 02/11/2013 3:39 PM  Signature: Joslyn Devon, RN 02/11/2013 3:39 PM  Signature: Liborio Nixon, RN 02/11/2013 3:39 PM  Signature:  02/11/2013 3:39 PM  Signature:   02/11/2013 3:39 PM  Signature:    Signature:    Signature:    Signature:    Signature:    Signature:      Scribe for Treatment Team:   Richelle Ito, LCSW  02/11/2013 3:39 PM

## 2013-02-11 NOTE — Progress Notes (Signed)
Recreation Therapy Notes  Date: 11.07.2014 Time: 9:30am Location: [address] Dayroom  Group Topic: Leisure Education  Goal Area(s) Addresses:  Patient will verbalize benefit of leisure. Patient will identify positive emotions associated with leisure.  Behavioral Response: Did not attend.   Hansika Leaming L Ramonia Mcclaran, LRT/CTRS  Crestina Strike L 02/11/2013 10:11 AM 

## 2013-02-11 NOTE — Progress Notes (Signed)
D: Patient denies SI/HI and A/V hallucinations; patient reports sleep is well; reports appetite is good; reports energy level is high ; reports ability to pay attention is improving; rates depression as 1/10; rates hopelessness 1/10; rates anxiety as 0/10; patient reports that he has no pain;   A: Monitored q 15 minutes; patient encouraged to attend groups; patient educated about medications; patient given medications per physician orders; patient encouraged to express feelings and/or concerns  R: Patient is calmer and cooperative;patient is not waiting by the door and constantly repeating "Im just waiting to leave;patient does still have his grandiose ideas and objectives but he is polite; patient's interaction with staff and peers is appropriate and minimal;  patient is taking medications as prescribed and tolerating medications; patient is not attending groups and has been sleep most of the day

## 2013-02-11 NOTE — Progress Notes (Signed)
Pt gave verbal permission to place mother's name in consent book to be able to have information on pt. Pt's mother's name was placed in book.

## 2013-02-11 NOTE — [facility] Group Notes (Signed)
[facility] LCSW Group Therapy  02/11/2013 3:57 PM  Type of Therapy:  Group Therapy   Participation Level:  Did not attend.    Summary of Progress/Problems:  Chaplain was here to lead a group on the theme of community.   Simona Huh   02/11/2013  3:57 PM

## 2013-02-12 MED ORDER — DIVALPROEX SODIUM 500 MG PO DR TAB
500.0000 mg | DELAYED_RELEASE_TABLET | Freq: Two times a day (BID) | ORAL | Status: DC
  Administered 2013-02-12 – 2013-02-15 (×7): 500 mg via ORAL
  Filled 2013-02-12 (×6): qty 1
  Filled 2013-02-12: qty 28
  Filled 2013-02-12: qty 1
  Filled 2013-02-12: qty 28
  Filled 2013-02-12: qty 1

## 2013-02-12 NOTE — Progress Notes (Signed)
Pt. Mother called inquiring about pts stay and how his night was. Writer expressed to mother the current status of the pt.

## 2013-02-12 NOTE — Progress Notes (Signed)
D:pt. In room. Pt. Stated that today was a good day. Pt. Stated in order to keep his mind off of negative stuff he works out in his room doing pushups, situps, and jogging back and forth. Pt is very euphoric and animated when talking about his future plans about going to [employer] and getting a degree in criminal justice. Denies SI/HI/AVH. Denies having any pain. Denies feeling depressed, hoplessness, or helpless. A: support and encouragement offered. q 15 min safety checks. scheduled and prn medications given R: pt remains safe on the unit

## 2013-02-12 NOTE — Progress Notes (Signed)
Patient ID: Paul Brown, male   DOB: May 30, 1990, 22 y.o.   MRN: [id number] D. Patient presents with euphoric, grandiose mood. Patient states to writer in am, '' I'm just fabulous, I'm doing fine. I slept great and I feel great thank you.I'm just taking my happy pills like you want me.  '' Patient with pressured speech at times, restless on the unit. Pt completed self inventory and rates depression at 1/10 on depression scale, 1 being least depressed and 10 being worst. Patient with somewhat disorganized thoughts , as when answering any physical problems patient states '' Nope! Just just exercising and shower. '' And then in response to changes to take better care of self patient reports : '' take my medication, listen to family, go to college major in criminal justice and psychology and play sports and get scholarships. '' A. Discussed above pt information with Earlene Plater NP. Support and encouragement provided. Medications given as ordered. R. Patient is able to be verbally redirected at this time. Will continue to monitor q 15 minutes for safety.

## 2013-02-12 NOTE — Progress Notes (Signed)
Pt. In room staring out the window, " star gazing". Pt stated that it was a really good day today. Pt stated he played basketball, did push ups, and crunches, drank a lot of water, did everything he could to be healthy. Pt. Stated that his grandfather came and saw him today, and he liked seeing him. Pt stated that he is close with his grandfather and likes talking to him. Writer asked pt if he would mind if his mother came and saw him, the pt stated he wouldn't mind seeing her, it would not bother him. Pt. Denies SI/HI/AVH. Denies having any pain. Pt is very grandiose and euphoric. Denies having any hoplessness, helplessness, or depression A: q 15 min safety checks. 1:1 time with pt. Support and encouragement given. scheduled and prn meds given R: pt. Remains safe on unit.

## 2013-02-12 NOTE — [facility] Group Notes (Signed)
[facility] Group Notes:  (Nursing/MHT/Case Management/Adjunct)  Date:  02/12/2013  Time:  10:52 AM  Type of Therapy:  Psychoeducational Skills  Participation Level:  Active  Participation Quality:  Inattentive and Redirectable  Affect:  Excited and Labile  Cognitive:  Disorganized  Insight:  Lacking  Engagement in Group:  Lacking, tangential  Modes of Intervention:  Discussion, Education and Exploration  Summary of Progress/Problems: healthy coping skills, self inventory review with RN.   Malva Limes 02/12/2013, 10:52 AM

## 2013-02-12 NOTE — Progress Notes (Signed)
Adult Psychoeducational Group Note  Date:  02/12/2013 Time:  8:52 PM  Group Topic/Focus:  Wrap-Up Group:   The focus of this group is to help patients review their daily goal of treatment and discuss progress on daily workbooks.  Participation Level:  Active  Participation Quality:  Appropriate  Affect:  Appropriate  Cognitive:  Appropriate  Insight: Appropriate  Engagement in Group:  Engaged  Modes of Intervention:  Discussion  Additional Comments: The patient said that he needs to follow the schedule and that he would do a better .The patient also thank the staff for his treatment.  Paul Brown 02/12/2013, 8:52 PM

## 2013-02-12 NOTE — Progress Notes (Signed)
Patient ID: Paul Brown, male   DOB: 1990/08/24, 22 y.o.   MRN: [id number]  [facility] MD Progress Note  02/12/2013 1:47 PM JULIS HAUBNER  MRN:  [id number]  Subjective: Patient states "I just staying in my room so I don't have any infractions that will keep me here longer. I stay in here doing pilates and exercising."  Objective: Patient observed in bed after lunch with the covers pulled up around his head. The patient greatly minimizes all psychiatric symptoms that resulted in his admission. Nursing staff report that the patient remains grandiose, delusional and is observed with manic behavior on the unit.  The patient refers to his medications as "happy pills" that he feels taking will help him leave the hospital sooner.   Diagnosis:   DSM5: Schizophrenia Disorders:  Delusional Disorder (297.1) Obsessive-Compulsive Disorders:   Trauma-Stressor Disorders:   Substance/Addictive Disorders:   Depressive Disorders:  Disruptive Mood Dysregulation Disorder (296.99)  Axis I: Unspecified Bipolar disorder           Delusional disorder, NOS  ADL's:  Intact  Sleep: fair  Appetite:  Fair  Suicidal Ideation: Denies  Homicidal Ideation: Denies  AEB (as evidenced by):  Psychiatric Specialty Exam: Review of Systems  Constitutional: Negative.   HENT: Negative.   Eyes: Negative.   Respiratory: Negative.   Cardiovascular: Negative.   Gastrointestinal: Negative.   Genitourinary: Negative.   Musculoskeletal: Negative.   Skin: Negative.   Neurological: Negative.   Endo/Heme/Allergies: Negative.   Psychiatric/Behavioral: Negative for depression and suicidal ideas. The patient is nervous/anxious.     Blood pressure 142/96, pulse 98, temperature 97.8 F (36.6 C), temperature source Oral, resp. rate 18, height 5\' 5"  (1.651 m), weight 48.988 kg (108 lb).Body mass index is 17.97 kg/(m^2).  General Appearance: Fairly Groomed  Patent attorney::  Minimal  Speech:  Pressured  Volume:  Increased   Mood:  Angry and Irritable  Affect:  Labile and Full Range  Thought Process:  Circumstantial  Orientation:  Full (Time, Place, and Person)  Thought Content:  Delusions  Suicidal Thoughts:  No  Homicidal Thoughts:  No  Memory:  Immediate;   Fair Recent;   Fair Remote;   Fair  Judgement:  Poor  Insight:  Lacking  Psychomotor Activity:  Increased  Concentration:  Poor  Recall:  Fair  Akathisia:  No  Handed:  Right  AIMS (if indicated):     Assets:  Physical Health  Sleep:  Number of Hours: 6.25   Current Medications: Current Facility-Administered Medications  Medication Dose Route Frequency Provider Last Rate Last Dose  . acetaminophen (TYLENOL) tablet 650 mg  650 mg Oral Q6H PRN Rachael Fee, MD      . alum & mag hydroxide-simeth (MAALOX/MYLANTA) 200-200-20 MG/5ML suspension 30 mL  30 mL Oral Q4H PRN Rachael Fee, MD      . divalproex (DEPAKOTE) DR tablet 500 mg  500 mg Oral BID PC Fransisca Kaufmann, NP      . feeding supplement (ENSURE COMPLETE) (ENSURE COMPLETE) liquid 237 mL  237 mL Oral BID BM Lavena Bullion, RD   237 mL at 02/11/13 1955  . LORazepam (ATIVAN) tablet 1 mg  1 mg Oral Q6H PRN Rachael Fee, MD   1 mg at 02/09/13 2351  . magnesium hydroxide (MILK OF MAGNESIA) suspension 30 mL  30 mL Oral Daily PRN Rachael Fee, MD      . OLANZapine zydis (ZYPREXA) disintegrating tablet 5 mg  5 mg Oral Q6H  PRN Rachael Fee, MD   5 mg at 02/12/13 0757  . OLANZapine zydis (ZYPREXA) disintegrating tablet 5 mg  5 mg Oral QHS Mojeed Akintayo   5 mg at 02/11/13 2114  . traZODone (DESYREL) tablet 50 mg  50 mg Oral QHS PRN,MR X 1 Fransisca Kaufmann, NP   50 mg at 02/11/13 2114    Lab Results:  No results found for this or any previous visit (from the past 48 hour(s)).  Physical Findings: AIMS: Facial and Oral Movements Muscles of Facial Expression: None, normal Lips and Perioral Area: None, normal Jaw: None, normal Tongue: None, normal,Extremity Movements Upper (arms, wrists, hands,  fingers): None, normal Lower (legs, knees, ankles, toes): None, normal, Trunk Movements Neck, shoulders, hips: None, normal, Overall Severity Severity of abnormal movements (highest score from questions above): None, normal Incapacitation due to abnormal movements: None, normal Patient's awareness of abnormal movements (rate only patient's report): No Awareness, Dental Status Current problems with teeth and/or dentures?: No Does patient usually wear dentures?: No  CIWA:    COWS:     Treatment Plan Summary: Daily contact with patient to assess and evaluate symptoms and progress in treatment Medication management  Plan:1. Continue crisis management and stabilization. 2. Medication management to reduce current symptoms to base line and improve the  patient's overall level of functioning: Increase Depakote 500 mg po bid for mood lability and Zyprexa 5mg  po Qhs for delusions. 3. Treat health problems as indicated. 4. Develop treatment plan to decrease risk of relapse upon discharge and the need for readmission. Encouraged the patient to participate in unit activities.  5. Psycho-social education regarding relapse prevention and self care. 6. Health care follow up as needed for medical problems.  Medical Decision Making Problem Points:  Established problem, slight improvement (1), Review of last therapy session (1) and Review of psycho-social stressors (1) Data Points:  Order Aims Assessment (2) Review of medication regiment & side effects (2) Review of new medications or change in dosage (2)  I certify that inpatient services furnished can reasonably be expected to improve the patient's condition.   Fransisca Kaufmann, NP-C 02/12/2013, 1:47 PM  Reviewed the information documented and agree with the treatment plan.  Lissy Deuser,JANARDHAHA R. 02/13/2013 3:29 PM

## 2013-02-13 MED ORDER — OLANZAPINE 10 MG PO TBDP
10.0000 mg | ORAL_TABLET | Freq: Every day | ORAL | Status: DC
  Administered 2013-02-13 – 2013-02-14 (×2): 10 mg via ORAL
  Filled 2013-02-13 (×2): qty 1
  Filled 2013-02-13: qty 14
  Filled 2013-02-13 (×2): qty 1

## 2013-02-13 NOTE — Progress Notes (Signed)
Patient ID: Paul Brown, male   DOB: Aug 12, 1990, 23 y.o.   MRN: [id number] D. The patient continues to present as euphoric and grandiose. His speech is still somewhat pressured, but improving. Very little insight regarding his treatment or plan of recovery. Remains focused on grandiose, delusional goals for school. Although he spent time in the dayroom interacting in the milieu, he continues to spend a great deal of his time exercising in his room. A. Met with patient 1:1 to assess. Encouraged to interact in milieu and attend evening group. Administered HS medication. R. Attended and participated in evening group. Compliant with medication.

## 2013-02-13 NOTE — Progress Notes (Signed)
Patient ID: Paul Brown, male   DOB: [birth year], 22 y.o.   MRN: [id number] Psychoeducational Group Note  Date:  02/13/2013 Time:  1515pm  Group Topic/Focus:  Making Healthy Choices:   The focus of this group is to help patients identify negative/unhealthy choices they were using prior to admission and identify positive/healthier coping strategies to replace them upon discharge.  Participation Level:  Did Not Attend  Participation Quality:    Affect: Cognitive:  Insight:  Engagement in Group:  Additional Comments:  Psychoeducational group   Valente David 02/13/2013,4:29 PM

## 2013-02-13 NOTE — [facility] Group Notes (Signed)
Adult Psychoeducational Group Note  Date:  02/13/2013 Time:  9:11 PM  Group Topic/Focus:  Wrap-Up Group:   The focus of this group is to help patients review their daily goal of treatment and discuss progress on daily workbooks.  Participation Level:  Active  Participation Quality:  Appropriate  Affect:  Appropriate  Cognitive:  Appropriate  Insight: Appropriate  Engagement in Group:  Engaged  Modes of Intervention: Discussion  Additional Comments:  Nayan expressed that he liked the music therapy group and that he was on cruise control most of the day.  He also stated that his support system was the [employer], family and God.  Caroll Rancher A 02/13/2013, 9:11 PM

## 2013-02-13 NOTE — [facility] Group Notes (Signed)
[facility] Group Notes:  (Clinical Social Work)  02/13/2013   11:15am-12:00pm  Summary of Progress/Problems:  The main focus of today's process group was to listen to a variety of genres of music and to identify that different types of music provoke different responses.  The patient then was able to identify personally what was soothing for them, as well as energizing.  Handouts were used to record feelings evoked, as well as how patient can personally use this knowledge in sleep habits, with depression, and with other symptoms.  The patient expressed understanding of concepts, as well as knowledge of how each type of music affected them and how this can be used when they are at home as a tool in their recovery.  Type of Therapy:  Music Therapy   Participation Level:  Active  Participation Quality:  Attentive and Sharing  Affect:  Blunted  Cognitive:  Oriented  Insight:  Engaged  Engagement in Therapy:  Engaged  Modes of Intervention:   Activity, Exploration  Ovadia Lopp Grossman-Orr, LCSW 02/13/2013, 12:30pm    

## 2013-02-13 NOTE — Progress Notes (Signed)
Patient ID: Paul Brown, male   DOB: [birth year], 22 y.o.   MRN: [id number]  [facility] MD Progress Note  02/13/2013 12:46 PM Paul Brown  MRN:  [id number] Subjective: Patient states "I'm doing great! I'm just having trouble focusing on projects I need to do for school."  Objective: Patient remains manic, grandiose, and demonstrates euphoric mood. His speech is pressured at times. He continues to report exercising in his room and moves the beds around to different locations in his room. Patient continues to minimize the symptoms that led to his admission and instead talks about grandiose life goals.   Diagnosis:   DSM5: Schizophrenia Disorders:  Delusional Disorder (297.1) Obsessive-Compulsive Disorders:   Trauma-Stressor Disorders:   Substance/Addictive Disorders:   Depressive Disorders:  Disruptive Mood Dysregulation Disorder (296.99)  Axis I: Unspecified Bipolar disorder           Delusional disorder, NOS  ADL's:  Intact  Sleep: Good  Appetite:  Fair  Suicidal Ideation: Denies  Homicidal Ideation: Denies  AEB (as evidenced by):  Psychiatric Specialty Exam: Review of Systems  Constitutional: Negative.   HENT: Negative.   Eyes: Negative.   Respiratory: Negative.   Cardiovascular: Negative.   Gastrointestinal: Negative.   Genitourinary: Negative.   Musculoskeletal: Negative.   Skin: Negative.   Neurological: Negative.   Endo/Heme/Allergies: Negative.   Psychiatric/Behavioral: Negative for depression, suicidal ideas and memory loss. The patient is nervous/anxious. The patient does not have insomnia.     Blood pressure 140/85, pulse 101, temperature 97.8 F (36.6 C), temperature source Oral, resp. rate 20, height 5\' 5"  (1.651 m), weight 48.988 kg (108 lb).Body mass index is 17.97 kg/(m^2).  General Appearance: Fairly Groomed  Patent attorney::  Minimal  Speech:  Pressured  Volume:  Increased  Mood:  Angry and Irritable  Affect:  Labile and Full Range  Thought Process:   Circumstantial  Orientation:  Full (Time, Place, and Person)  Thought Content:  Delusions  Suicidal Thoughts:  No  Homicidal Thoughts:  No  Memory:  Immediate;   Fair Recent;   Fair Remote;   Fair  Judgement:  Poor  Insight:  Lacking  Psychomotor Activity:  Increased  Concentration:  Poor  Recall:  Fair  Akathisia:  No  Handed:  Right  AIMS (if indicated):     Assets:  Physical Health  Sleep:  Number of Hours: 6.75   Current Medications: Current Facility-Administered Medications  Medication Dose Route Frequency Provider Last Rate Last Dose  . acetaminophen (TYLENOL) tablet 650 mg  650 mg Oral Q6H PRN Rachael Fee, MD   650 mg at 02/13/13 1046  . alum & mag hydroxide-simeth (MAALOX/MYLANTA) 200-200-20 MG/5ML suspension 30 mL  30 mL Oral Q4H PRN Rachael Fee, MD      . divalproex (DEPAKOTE) DR tablet 500 mg  500 mg Oral BID PC Fransisca Kaufmann, NP   500 mg at 02/13/13 0751  . feeding supplement (ENSURE COMPLETE) (ENSURE COMPLETE) liquid 237 mL  237 mL Oral BID BM Lavena Bullion, RD   237 mL at 02/12/13 1943  . LORazepam (ATIVAN) tablet 1 mg  1 mg Oral Q6H PRN Rachael Fee, MD   1 mg at 02/09/13 2351  . magnesium hydroxide (MILK OF MAGNESIA) suspension 30 mL  30 mL Oral Daily PRN Rachael Fee, MD      . OLANZapine zydis (ZYPREXA) disintegrating tablet 5 mg  5 mg Oral Q6H PRN Rachael Fee, MD   5 mg at 02/13/13  4540  . OLANZapine zydis (ZYPREXA) disintegrating tablet 5 mg  5 mg Oral QHS Mojeed Akintayo   5 mg at 02/12/13 2103  . traZODone (DESYREL) tablet 50 mg  50 mg Oral QHS PRN,MR X 1 Fransisca Kaufmann, NP   50 mg at 02/12/13 2103    Lab Results:  No results found for this or any previous visit (from the past 48 hour(s)).  Physical Findings: AIMS: Facial and Oral Movements Muscles of Facial Expression: None, normal Lips and Perioral Area: None, normal Jaw: None, normal Tongue: None, normal,Extremity Movements Upper (arms, wrists, hands, fingers): None, normal Lower (legs, knees,  ankles, toes): None, normal, Trunk Movements Neck, shoulders, hips: None, normal, Overall Severity Severity of abnormal movements (highest score from questions above): None, normal Incapacitation due to abnormal movements: None, normal Patient's awareness of abnormal movements (rate only patient's report): No Awareness, Dental Status Current problems with teeth and/or dentures?: No Does patient usually wear dentures?: No  CIWA:    COWS:     Treatment Plan Summary: Daily contact with patient to assess and evaluate symptoms and progress in treatment Medication management  Plan:1. Continue crisis management and stabilization. 2. Medication management to reduce current symptoms to base line and improve the  patient's overall level of functioning: Continue Depakote 500 mg po bid for mood lability and increase Zyprexa to 10 mg po Qhs for delusions. 3. Treat health problems as indicated. 4. Develop treatment plan to decrease risk of relapse upon discharge and the need for readmission. Encouraged the patient to participate in unit activities.  5. Psycho-social education regarding relapse prevention and self care. 6. Health care follow up as needed for medical problems.  Medical Decision Making Problem Points:  Established problem, slight improvement (1), Review of last therapy session (1) and Review of psycho-social stressors (1) Data Points:  Order Aims Assessment (2) Review of medication regiment & side effects (2) Review of new medications or change in dosage (2)  I certify that inpatient services furnished can reasonably be expected to improve the patient's condition.   Fransisca Kaufmann, NP-C 02/13/2013, 12:46 PM  Reviewed the information documented and agree with the treatment plan.  Holly Pring,JANARDHAHA R. 02/13/2013 3:37 PM

## 2013-02-13 NOTE — Progress Notes (Signed)
Patient ID: ISAIR INABINET, male   DOB: 1990-05-13, 22 y.o.   MRN: [id number]  D. Patient continues to  present with euphoric, grandiose mood . Noted patient has moved  bedroom furniture both beds moved to opposite side of room, to opposite corner. Patient states '' I got bored last night before going to bed so I moved my beds so I could see out the window. I'm doing great. I'm staying positive.'' Patient with pressured speech at times, restless on the unit. Pt completed self inventory and rates depression at 1/10 on depression scale, 1 being least depressed and 10 being worst, and continues to endorse his energy levels as high. A. Support and encouragement provided. Medications given as ordered. Pt compliant with medications.  R. Patient is able to be verbally redirected at this time. In no acute distress. Pt denies SI/HI. Will continue to monitor q 15 minutes for safety.

## 2013-02-13 NOTE — Progress Notes (Signed)
Patient ID: Paul Brown, male   DOB: May 18, 1990, 22 y.o.   MRN: [id number] D. Resting in bed with eyes closed. Two beds pushed together. A. Q 15 minute checks maintained for safety. R. The patient remains safe.

## 2013-02-13 NOTE — [facility] Group Notes (Signed)
[facility] Group Notes:  (Nursing/MHT/Case Management/Adjunct)  Date:  02/13/2013  Time:  10:41 AM  Type of Therapy:  Psychoeducational Skills  Participation Level:  Minimal  Participation Quality:  Inattentive  Affect:  Excited  Cognitive:  Lacking  Insight:  Lacking  Engagement in Group:  Lacking  Modes of Intervention:  Discussion and Education  Summary of Progress/Problems: Self inventory review with RN and healthy support systems.  Paul Brown 02/13/2013, 10:41 AM

## 2013-02-14 NOTE — Progress Notes (Signed)
Date: 02/14/2013  Time: 11:31 AM  Group Topic/Focus:  Wellness Toolbox: The focus of this group is to discuss various aspects of wellness, balancing those aspects and exploring ways to increase the ability to experience wellness. Patients will create a wellness toolbox for use upon discharge.  Participation Level: Active  Participation Quality: Appropriate, Sharing and Supportive  Affect: Appropriate  Cognitive: Appropriate  Insight: Appropriate  Engagement in Group: Engaged and Supportive  Modes of Intervention: Discussion, Education, Problem-solving and Support  Additional Comments: Pt attended group.  Paul Brown M  02/14/2013, 11:31 AM

## 2013-02-14 NOTE — [facility] Group Notes (Signed)
[facility] LCSW Group Therapy  02/14/2013 1:15 pm  Type of Therapy: Process Group Therapy  Participation Level:  Active  Participation Quality:  Appropriate  Affect:  Flat  Cognitive:  Oriented  Insight:  Improving  Engagement in Group:  Limited  Engagement in Therapy:  Limited  Modes of Intervention:  Activity, Clarification, Education, Problem-solving and Support  Summary of Progress/Problems: Today's group addressed the issue of overcoming obstacles.  Patients were asked to identify their biggest obstacle post d/c that stands in the way of their on-going success, and then problem solve as to how to manage this. Paul Brown participated in his vague, general way.  He identified several different goals and obstacles, as well as speaking about Darwin and the theory of evolution.  [name] B 02/14/2013   1:48 PM

## 2013-02-14 NOTE — [facility] Group Notes (Signed)
[facility] LCSW Aftercare Discharge Planning Group Note   02/14/2013 8:12 AM  Participation Quality:  Engaged  Mood/Affect:  Appropriate  Depression Rating:  denies  Anxiety Rating:  denies  Thoughts of Suicide:  No Will you contract for safety?   NA  Current AVH:  No  Plan for Discharge/Comments:  Jaxston was in a good mood.  States he had visitors over the weekend; grandmother, grandfather and mother all came at different times.  Plans to return home, continue taking medication, follow up outpt and focus on enrolling in school for the spring semester.  Also threw in that he wants to be a IT sales professional.  Transportation Means: mother  Supports: family  [place], Baldo Daub

## 2013-02-14 NOTE — Tx Team (Signed)
  Interdisciplinary Treatment Plan Update   Date Reviewed:  02/14/2013  Time Reviewed:  11:10 AM  Progress in Treatment:   Attending groups: Yes Participating in groups: Yes Taking medication as prescribed: Yes  Tolerating medication: Yes Family/Significant other contact made: Yes  Patient understands diagnosis: Yes  Discussing patient identified problems/goals with staff: Yes Medical problems stabilized or resolved: Yes Denies suicidal/homicidal ideation: Yes Patient has not harmed self or others: Yes  For review of initial/current patient goals, please see plan of care.  Estimated Length of Stay:  Likely d/c tomorrow  Reason for Continuation of Hospitalization:   New Problems/Goals identified:  N/A  Discharge Plan or Barriers:   return home, follow up outpt  Additional Comments:  Attendees:  Signature: Thedore Mins, MD 02/14/2013 11:10 AM   Signature: Richelle Ito, LCSW 02/14/2013 11:10 AM  Signature: Fransisca Kaufmann, NP 02/14/2013 11:10 AM  Signature: Joslyn Devon, RN 02/14/2013 11:10 AM  Signature: Liborio Nixon, RN 02/14/2013 11:10 AM  Signature:  02/14/2013 11:10 AM  Signature:   02/14/2013 11:10 AM  Signature:    Signature:    Signature:    Signature:    Signature:    Signature:      Scribe for Treatment Team:   Richelle Ito, LCSW  02/14/2013 11:10 AM

## 2013-02-14 NOTE — Progress Notes (Signed)
Recreation Therapy Notes  Date: 11.10.2014 Time: 9:30am Location: [address] Dayroom   Group Topic: Self-Esteem  Goal Area(s) Addresses:  Patient will define self-esteem.  Patient will identify how self-esteem effects his/her life.   Behavioral Response: Engaged, Appropraite   Intervention: Designer, multimedia.   Activity: Patient was asked to select a question from provided container, provide an answer and given the opportunity to ask a group member to answer the selected question.   Education:  Self-Esteem, Scientist, physiological, Discharge Planning.   Education Outcome: Needs additional education  Clinical Observations/Feedback: Patient participated, answering selected question appropriately. Patient listened to peer answers as they answered selected questions and offered support and encouragement when needed. Patient had some difficulty sitting still during group session, often changing his position and hanging his head off of the sofa he was sitting on, while simultaneously putting his feet on the wall behind the sofa. Patient shared he thinks he has a healthy self-esteem, but that he would like to be more intellectual to help improve his self-esteem.   Marykay Lex Elize Pinon, LRT/CTRS  Octavia Velador L 02/14/2013 12:03 PM

## 2013-02-14 NOTE — [facility] Group Notes (Signed)
Adult Psychoeducational Group Note  Date:  02/14/2013 Time:  9:33 PM  Group Topic/Focus:  Wrap-Up Group:   The focus of this group is to help patients review their daily goal of treatment and discuss progress on daily workbooks.  Participation Level:  Active  Participation Quality:  Appropriate  Affect:  Appropriate  Cognitive:  Appropriate  Insight: Appropriate  Engagement in Group:  Engaged  Modes of Intervention:  Discussion  Additional Comments:  Paul Brown stated his day was great and enjoyed being involved in groups.  His coping skill is handle things in a positive way instead of a negative way.  Caroll Rancher A 02/14/2013, 9:33 PM

## 2013-02-14 NOTE — Progress Notes (Signed)
Patient ID: Paul Brown, male   DOB: 03-10-1991, 22 y.o.   MRN: [id number] D:Patient presents with bright affect; euphoric mood.  He still has grandious thoughts of aspiring to be a division one basketball player in college.  He denies any SI/HI/AVH.  He is eager for discharge.  Plan for discharge is set for tomorrow.  A:Continue to monitor medication management and MD orders.  Safety checks completed every 15 minutes per protocol.  R:Patient still remains isolative to room and has minimal interaction with staff.

## 2013-02-14 NOTE — Progress Notes (Signed)
Patient ID: Paul Brown, male   DOB: [birth year], 22 y.o.   MRN: [id number]  [facility] MD Progress Note  02/14/2013 5:11 PM Paul Brown  MRN:  [id number] Subjective: Met with the patient 1:1 in his room. He was in bed during the group time. He notes that he is fine. Feels great! Has no problems and is ready to return to school where he will play every sport! Objective: Patient remains manic, grandiose, and demonstrates euphoric mood. He is pleasant and cooperative, speech continues to be pressured. He is fully oriented x3 and goal directed. He denies AVH, SI or HI.   Diagnosis:   DSM5: Schizophrenia Disorders:  Delusional Disorder (297.1) Obsessive-Compulsive Disorders:   Trauma-Stressor Disorders:   Substance/Addictive Disorders:   Depressive Disorders:  Disruptive Mood Dysregulation Disorder (296.99)  Axis I: Unspecified Bipolar disorder           Delusional disorder, NOS  ADL's:  Intact  Sleep: Good  Appetite:  Fair  Suicidal Ideation: Denies  Homicidal Ideation: Denies  AEB (as evidenced by):  Psychiatric Specialty Exam: Review of Systems  Constitutional: Negative.  Negative for fever, chills, weight loss, malaise/fatigue and diaphoresis.  HENT: Negative.  Negative for congestion and sore throat.   Eyes: Negative.  Negative for blurred vision, double vision and photophobia.  Respiratory: Negative.  Negative for cough, shortness of breath and wheezing.   Cardiovascular: Negative.  Negative for chest pain, palpitations and PND.  Gastrointestinal: Negative.  Negative for heartburn, nausea, vomiting, abdominal pain, diarrhea and constipation.  Genitourinary: Negative.   Musculoskeletal: Negative.  Negative for falls, joint pain and myalgias.  Skin: Negative.   Neurological: Negative.  Negative for dizziness, tingling, tremors, sensory change, speech change, focal weakness, seizures, loss of consciousness, weakness and headaches.  Endo/Heme/Allergies: Negative.  Negative  for polydipsia. Does not bruise/bleed easily.  Psychiatric/Behavioral: Negative for depression, suicidal ideas, hallucinations, memory loss and substance abuse. The patient is nervous/anxious. The patient does not have insomnia.     Blood pressure 133/87, pulse 128, temperature 98 F (36.7 C), temperature source Oral, resp. rate 20, height 5\' 5"  (1.651 m), weight 48.988 kg (108 lb), SpO2 99.00%.Body mass index is 17.97 kg/(m^2).  General Appearance: Fairly Groomed  Patent attorney::  Minimal  Speech:  Pressured  Volume:  Increased  Mood:  Angry and Irritable  Affect:  Labile and Full Range  Thought Process:  Circumstantial  Orientation:  Full (Time, Place, and Person)  Thought Content:  Delusions  Suicidal Thoughts:  No  Homicidal Thoughts:  No  Memory:  Immediate;   Fair Recent;   Fair Remote;   Fair  Judgement:  Poor  Insight:  Lacking  Psychomotor Activity:  Increased  Concentration:  Poor  Recall:  Fair  Akathisia:  No  Handed:  Right  AIMS (if indicated):     Assets:  Physical Health  Sleep:  Number of Hours: 6.5   Current Medications: Current Facility-Administered Medications  Medication Dose Route Frequency Provider Last Rate Last Dose  . acetaminophen (TYLENOL) tablet 650 mg  650 mg Oral Q6H PRN Rachael Fee, MD   650 mg at 02/13/13 1046  . alum & mag hydroxide-simeth (MAALOX/MYLANTA) 200-200-20 MG/5ML suspension 30 mL  30 mL Oral Q4H PRN Rachael Fee, MD      . divalproex (DEPAKOTE) DR tablet 500 mg  500 mg Oral BID PC Fransisca Kaufmann, NP   500 mg at 02/14/13 0806  . feeding supplement (ENSURE COMPLETE) (ENSURE COMPLETE) liquid 237 mL  237 mL Oral BID BM Lavena Bullion, RD   237 mL at 02/14/13 1508  . LORazepam (ATIVAN) tablet 1 mg  1 mg Oral Q6H PRN Rachael Fee, MD   1 mg at 02/09/13 2351  . magnesium hydroxide (MILK OF MAGNESIA) suspension 30 mL  30 mL Oral Daily PRN Rachael Fee, MD      . OLANZapine zydis (ZYPREXA) disintegrating tablet 10 mg  10 mg Oral QHS Fransisca Kaufmann, NP   10 mg at 02/13/13 2149  . OLANZapine zydis (ZYPREXA) disintegrating tablet 5 mg  5 mg Oral Q6H PRN Rachael Fee, MD   5 mg at 02/13/13 0751  . traZODone (DESYREL) tablet 50 mg  50 mg Oral QHS PRN,MR X 1 Fransisca Kaufmann, NP   50 mg at 02/12/13 2103    Lab Results:  No results found for this or any previous visit (from the past 48 hour(s)).  Physical Findings: AIMS: Facial and Oral Movements Muscles of Facial Expression: None, normal Lips and Perioral Area: None, normal Jaw: None, normal Tongue: None, normal,Extremity Movements Upper (arms, wrists, hands, fingers): None, normal Lower (legs, knees, ankles, toes): None, normal, Trunk Movements Neck, shoulders, hips: None, normal, Overall Severity Severity of abnormal movements (highest score from questions above): None, normal Incapacitation due to abnormal movements: None, normal Patient's awareness of abnormal movements (rate only patient's report): No Awareness, Dental Status Current problems with teeth and/or dentures?: No Does patient usually wear dentures?: No  CIWA:    COWS:     Treatment Plan Summary: Daily contact with patient to assess and evaluate symptoms and progress in treatment Medication management  Plan:1. Continue crisis management and stabilization. 2. Medication management to reduce current symptoms to base line and improve the  patient's overall level of functioning: Continue Depakote 500 mg po bid for mood lability and increase Zyprexa to 10 mg po Qhs for delusions. 3. Treat health problems as indicated. 4. Develop treatment plan to decrease risk of relapse upon discharge and the need for readmission. Encouraged the patient to participate in unit activities.  5. Psycho-social education regarding relapse prevention and self care. 6. Health care follow up as needed for medical problems.  Medical Decision Making Problem Points:  Established problem, slight improvement (1), Review of last therapy session  (1) and Review of psycho-social stressors (1) Data Points:  Order Aims Assessment (2) Review of medication regiment & side effects (2) Review of new medications or change in dosage (2) Lloyd Huger T. Senovia Gauer RPAC 5:14 PM 02/14/2013  I certify that inpatient services furnished can reasonably be expected to improve the patient's condition.  Reviewed the information documented and agree with the treatment plan.

## 2013-02-14 NOTE — [facility] Suicide Risk Assessment (Signed)
[facility] INPATIENT:  Family/Significant Other Suicide Prevention Education  Suicide Prevention Education:  Education Completed; No one has been identified by the patient as the family member/significant other with whom the patient will be residing, and identified as the person(s) who will aid the patient in the event of a mental health crisis (suicidal ideations/suicide attempt).  With written consent from the patient, the family member/significant other has been provided the following suicide prevention education, prior to the and/or following the discharge of the patient.  The suicide prevention education provided includes the following:  Suicide risk factors  Suicide prevention and interventions  National Suicide Hotline telephone number  [facility] assessment telephone number  [facility] Emergency Assistance 911  [facility] and/or Residential Mobile Crisis Unit telephone number  Request made of family/significant other to:  Remove weapons (e.g., guns, rifles, knives), all items previously/currently identified as safety concern.    Remove drugs/medications (over-the-counter, prescriptions, illicit drugs), all items previously/currently identified as a safety concern.  The family member/significant other verbalizes understanding of the suicide prevention education information provided.  The family member/significant other agrees to remove the items of safety concern listed above. The patient did not endorse SI at the time of admission, nor did the patient c/o SI during the stay here.  SPE not required.    [name] B 02/14/2013, 11:12 AM

## 2013-02-15 DIAGNOSIS — F39 Unspecified mood [affective] disorder: Secondary | ICD-10-CM

## 2013-02-15 MED ORDER — TRAZODONE HCL 50 MG PO TABS: 50.0000 mg | ORAL_TABLET | Freq: Every evening | ORAL | Status: DC | PRN

## 2013-02-15 MED ORDER — DIVALPROEX SODIUM 500 MG PO DR TAB: 500.0000 mg | DELAYED_RELEASE_TABLET | Freq: Two times a day (BID) | ORAL | Status: DC

## 2013-02-15 MED ORDER — OLANZAPINE 10 MG PO TBDP: 10.0000 mg | ORAL_TABLET | Freq: Every day | ORAL | Status: DC

## 2013-02-15 MED ORDER — CHLORPHENIRAMINE MALEATE 4 MG PO TABS: 4.0000 mg | ORAL_TABLET | Freq: Four times a day (QID) | ORAL | Status: DC | PRN

## 2013-02-15 NOTE — Progress Notes (Signed)
Patient ID: Paul Brown, male   DOB: November 28, 1990, 22 y.o.   MRN: [id number] Patient discharged home per MD orders.  He received all his personal belongings, prescriptions and medication samples.  Patient denies any SI/HI/AVH.  Patient left ambulatory with his mother.

## 2013-02-15 NOTE — [facility] Suicide Risk Assessment (Signed)
Suicide Risk Assessment  Discharge Assessment     Demographic Factors:  Male, Low socioeconomic status, Unemployed and African American  Mental Status Per Nursing Assessment::   On Admission:     Current Mental Status by Physician: patient denies suicidal ideation, intent or plan  Loss Factors: Financial problems/change in socioeconomic status  Historical Factors: Impulsivity  Risk Reduction Factors:   Sense of responsibility to family, Living with another person, especially a relative and Positive social support  Continued Clinical Symptoms:  Resolving mood symptoms  Cognitive Features That Contribute To Risk:  Closed-mindedness Polarized thinking    Suicide Risk:  Minimal: No identifiable suicidal ideation.  Patients presenting with no risk factors but with morbid ruminations; may be classified as minimal risk based on the severity of the depressive symptoms  Discharge Diagnoses:   AXIS I: Unspecified episodic mood disorder. Delusional disorder  AXIS II:  Deferred AXIS III:   Past Medical History  Diagnosis Date   AXIS IV:  other psychosocial or environmental problems and problems related to social environment AXIS V:  61-70 mild symptoms  Plan Of Care/Follow-up recommendations:  Activity:  as tolerated Diet:  healthy Tests:  Depakote level Other:  patient to keep his after care appointment  Is patient on multiple antipsychotic therapies at discharge:  No   Has Patient had three or more failed trials of antipsychotic monotherapy by history:  No  Recommended Plan for Multiple Antipsychotic Therapies: NA  Thedore Mins, MD 02/15/2013, 10:19 AM

## 2013-02-15 NOTE — Progress Notes (Signed)
Patient ID: Paul Brown, male   DOB: 1990/09/24, 22 y.o.   MRN: [id number]  D: Pt informed the writer that he has a pending discharge. When asked about his plans pt stated that he intends on going back to college. Stated, "I think my mom wants me to give college a break, but I'm going back".  Pt also stated that he plans to take his medications "like he's supposed to".   A:  Support and encouragement was offered. 15 min checks continued for safety.  R: Pt remains safe.

## 2013-02-15 NOTE — Progress Notes (Signed)
Adult Psychoeducational Group Note  Date:  02/15/2013 Time:  9:30AM Group Topic/Focus:  Recovery Goals:   The focus of this group is to identify appropriate goals for recovery and establish a plan to achieve them.  Participation Level:  Did Not Attend   Additional Comments:  Pt. Didn't attend group.  Bing Plume D 02/15/2013, 10:54 AM

## 2013-02-15 NOTE — Progress Notes (Signed)
Seen and agreed. Isamar Nazir, MD 
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
## 2013-02-17 NOTE — Discharge Summary (Signed)
Seen and agreed. Deneka Greenwalt, MD 

## 2013-02-18 NOTE — Progress Notes (Signed)
Patient Discharge Instructions:  After Visit Summary (AVS):   Faxed to:  02/18/13 Discharge Summary Note:   Faxed to:  02/18/13 Psychiatric Admission Assessment Note:   Faxed to:  02/18/13 Suicide Risk Assessment - Discharge Assessment:   Faxed to:  02/18/13 Faxed/Sent to the Next Level Care provider:  02/18/13 Faxed to [facility] @ [phone]  Jerelene Redden, 02/18/2013, 4:43 PM

## 2014-05-29 ENCOUNTER — Encounter (HOSPITAL_COMMUNITY): Payer: Self-pay | Admitting: *Deleted

## 2014-05-29 ENCOUNTER — Emergency Department (HOSPITAL_COMMUNITY): Admission: EM | Admit: 2014-05-29 | Discharge: 2014-05-29 | Discharge status: Absent without leave | Payer: Self-pay

## 2014-05-29 DIAGNOSIS — F419 Anxiety disorder, unspecified: Secondary | ICD-10-CM | POA: Insufficient documentation

## 2014-05-29 DIAGNOSIS — F329 Major depressive disorder, single episode, unspecified: Secondary | ICD-10-CM | POA: Insufficient documentation

## 2014-05-29 DIAGNOSIS — R51 Headache: Secondary | ICD-10-CM | POA: Insufficient documentation

## 2014-05-29 DIAGNOSIS — Z79899 Other long term (current) drug therapy: Secondary | ICD-10-CM | POA: Insufficient documentation
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
## 2014-05-30 ENCOUNTER — Emergency Department (HOSPITAL_COMMUNITY)
Admission: EM | Admit: 2014-05-30 | Discharge: 2014-05-30 | Disposition: A | Discharge status: Home / Self Care | Payer: Self-pay | Attending: Emergency Medicine | Admitting: Emergency Medicine

## 2014-05-30 DIAGNOSIS — R440 Auditory hallucinations: Secondary | ICD-10-CM

## 2014-05-30 DIAGNOSIS — R519 Headache, unspecified: Secondary | ICD-10-CM

## 2014-05-30 DIAGNOSIS — R51 Headache: Secondary | ICD-10-CM

## 2014-05-30 MED ORDER — IBUPROFEN 600 MG PO TABS: 600.0000 mg | ORAL_TABLET | Freq: Four times a day (QID) | ORAL | Status: DC | PRN

## 2014-05-30 MED ORDER — IBUPROFEN 200 MG PO TABS
600.0000 mg | ORAL_TABLET | Freq: Once | ORAL | Status: AC
  Administered 2014-05-30: 600 mg via ORAL
  Filled 2014-05-30: qty 3

## 2014-05-30 NOTE — ED Provider Notes (Signed)
CSN: [id number]     Arrival date & time 05/29/14  2036 History  This chart was scribed for Paul Raceravid Kweli Grassel, MD by Abel PrestoKara Demonbreun, ED Scribe. This patient was seen in room A09C/A09C and the patient's care was started at 12:39 AM.    Chief Complaint  Patient presents with  . Headache      Patient is a 24 y.o. male presenting with headaches. The history is provided by the patient. No language interpreter was used.  Headache Associated symptoms: no abdominal pain, no back pain, no dizziness, no fever, no nausea, no neck pain, no neck stiffness, no numbness, no sinus pressure, no vomiting and no weakness    HPI Comments: Paul Brown is a 24 y.o. male with PMHx of mood disorder, anxiety, and depression who presents to the Emergency Department complaining of constant headache with onset yesterday morning that has mostly resolved. Pt notes his head feels "hollow". Pt has had similar headache in the past. Pt took Tylenol with some relief. No nausea or vomiting. No photophobia. No focal weakness or numbness. No neck pain or stiffness. No fever or chills. Pt states he has been hearing voices" since he was in high school". This is not changed. He states voices comment on his actions. No command hallucinations. Patient denies any suicidal or homicidal ideation. Pt has not been compliant with medication since May 2015.  Pt denies EtOH or drug use. [facility] but notes he has not been seen in a long time.     Past Medical History  Diagnosis Date  . Mood disorder anxiety  . Anxiety depression  . Depression    History reviewed. No pertinent past surgical history. No family history on file. History  Substance Use Topics  . Smoking status: Never Smoker   . Smokeless tobacco: Never Used  . Alcohol Use: No    Review of Systems  Constitutional: Negative for fever and chills.  HENT: Negative for sinus pressure.   Eyes: Negative for visual disturbance.  Respiratory: Negative for  shortness of breath.   Cardiovascular: Negative for chest pain.  Gastrointestinal: Negative for nausea, vomiting and abdominal pain.  Musculoskeletal: Negative for back pain, neck pain and neck stiffness.  Skin: Negative for rash and wound.  Neurological: Positive for headaches (partially resolved). Negative for dizziness, weakness, light-headedness and numbness.  Psychiatric/Behavioral: Positive for hallucinations. Negative for suicidal ideas, confusion, self-injury, dysphoric mood and agitation.  All other systems reviewed and are negative.     Allergies  Review of patient's allergies indicates no known allergies.  Home Medications   Prior to Admission medications   Medication Sig Start Date End Date Taking? Authorizing Provider  acetaminophen (TYLENOL) 500 MG tablet Take 1,000 mg by mouth every 6 (six) hours as needed for pain.   Yes Historical Provider, MD  traZODone (DESYREL) 50 MG tablet Take 1 tablet (50 mg total) by mouth at bedtime as needed and may repeat dose one time if needed for sleep. 02/15/13  Yes Fransisca KaufmannLaura Davis, NP  chlorpheniramine (ALLER-CHLOR) 4 MG tablet Take 1 tablet (4 mg total) by mouth every 6 (six) hours as needed. Patient not taking: Reported on 05/30/2014 02/15/13   Fransisca KaufmannLaura Davis, NP  divalproex (DEPAKOTE) 500 MG DR tablet Take 1 tablet (500 mg total) by mouth 2 (two) times daily after a meal. Patient not taking: Reported on 05/30/2014 02/15/13   Fransisca KaufmannLaura Davis, NP  ibuprofen (ADVIL,MOTRIN) 600 MG tablet Take 1 tablet (600 mg total) by mouth every 6 (  six) hours as needed. 05/30/14   Paul Racer, MD  OLANZapine zydis (ZYPREXA) 10 MG disintegrating tablet Take 1 tablet (10 mg total) by mouth at bedtime. Patient not taking: Reported on 05/30/2014 02/15/13   Fransisca Kaufmann, NP   BP 170/96 mmHg  Pulse 64  Temp(Src) 98 F (36.7 C) (Oral)  Resp 19  SpO2 97% Physical Exam  Constitutional: He is oriented to person, place, and time. He appears well-developed and  well-nourished. No distress.  HENT:  Head: Normocephalic and atraumatic.  Mouth/Throat: Oropharynx is clear and moist. No oropharyngeal exudate.  Eyes: EOM are normal. Pupils are equal, round, and reactive to light.  Neck: Normal range of motion. Neck supple.  No meningismus. Posterior midline cervical tenderness to palpation.  Cardiovascular: Normal rate and regular rhythm.  Exam reveals no gallop and no friction rub.   No murmur heard. Pulmonary/Chest: Effort normal and breath sounds normal. No respiratory distress. He has no wheezes. He has no rales.  Abdominal: Soft. Bowel sounds are normal. He exhibits no distension and no mass. There is no tenderness. There is no rebound and no guarding.  Musculoskeletal: Normal range of motion. He exhibits no edema or tenderness.  Neurological: He is alert and oriented to person, place, and time.  Patient is alert and oriented x3 with clear, goal oriented speech. Patient has 5/5 motor in all extremities. Sensation is intact to light touch. Bilateral finger-to-nose is normal with no signs of dysmetria. Patient has a normal gait and walks without assistance.  Skin: Skin is warm and dry. No rash noted. No erythema.  Psychiatric: He has a normal mood and affect. His behavior is normal.  Patient cooperative but with poor insight. Not responding to internal stimuli. No HI/SI.   Nursing note and vitals reviewed.   ED Course  Procedures (including critical care time) DIAGNOSTIC STUDIES: Oxygen Saturation is 97% on room air, normal by my interpretation.    COORDINATION OF CARE: 12:44 AM Discussed treatment plan with patient at beside, the patient agrees with the plan and has no further questions at this time.   Labs Review Labs Reviewed - No data to display  Imaging Review No results found.   EKG Interpretation None      MDM   Final diagnoses:  Acute nonintractable headache, unspecified headache type  Auditory hallucinations    I  personally performed the services described in this documentation, which was scribed in my presence. The recorded information has been reviewed and is accurate.  Headache without any neurological features. This also resolved at this time. I do not believe further workup is necessary at this point. No red flag signs or symptoms. Patient with long-term hallucinations. Since he stopped his medication on months ago. He has no suicidal or homicidal ideations. Does not appear to be an acute danger to himself or others. We'll give outpatient resources. Return precautions given.    Paul Racer, MD 05/30/14 [phone]

## 2014-05-30 NOTE — Discharge Instructions (Signed)
°Emergency Department Resource Guide °1) Find a Doctor and Pay Out of Pocket °Although you won't have to find out who is covered by your insurance plan, it is a good idea to ask around and get recommendations. You will then need to call the office and see if the doctor you have chosen will accept you as a new patient and what types of options they offer for patients who are self-pay. Some doctors offer discounts or will set up payment plans for their patients who do not have insurance, but you will need to ask so you aren't surprised when you get to your appointment. ° °2) Contact Your Local Health Department °Not all health departments have doctors that can see patients for sick visits, but many do, so it is worth a call to see if yours does. If you don't know where your local health department is, you can check in your phone book. The CDC also has a tool to help you locate your state's health department, and many state websites also have listings of all of their local health departments. ° °3) Find a Walk-in Clinic °If your illness is not likely to be very severe or complicated, you may want to try a walk in clinic. These are popping up all over the country in pharmacies, drugstores, and shopping centers. They're usually staffed by nurse practitioners or physician assistants that have been trained to treat common illnesses and complaints. They're usually fairly quick and inexpensive. However, if you have serious medical issues or chronic medical problems, these are probably not your best option. ° °No Primary Care Doctor: °- Call Health Connect at  832-8000 - they can help you locate a primary care doctor that  accepts your insurance, provides certain services, etc. °- Physician Referral Service- 1-800-533-3463 ° °Chronic Pain Problems: °Organization         Address  Phone   Notes  °[facility]  (336) 297-2271 Patients need to be referred by their primary care doctor.  ° °Medication  Assistance: °Organization         Address  Phone   Notes  °[place] [facility] [address] °[place], [place] [address] (336) 641-8030 --Must be a resident of [place] °-- Must have NO insurance coverage whatsoever (no Medicaid/ Medicare, etc.) °-- The pt. MUST have a primary care doctor that directs their care regularly and follows them in the community °  °[facility]  (866) 331-1348   °[employer]  (888) 892-1162   ° °Agencies that provide inexpensive medical care: °Organization         Address  Phone   Notes  °[facility]  (336) 832-8035   °[facility]    (336) 832-7272   °[facility] [address] °[place], [place] [address] (336) 832-4777   °[facility] [address], °[place] (336) 271-4999   °[facility]    (336) 373-0678   °[facility]    (336) 272-1050   °[facility] ° [address], [place] Phone:  (336) 832-4444, Fax:  (336) 832-4440 Hours of Operation:  9 am - 6 pm, M-F.  Also accepts Medicaid/Medicare and self-pay.  °[facility] ° [address],  Phone: (336) 832-3150, Fax: (336) 832-3151. Hours of Operation:  8:30 am - 5:30 pm, M-F.  Also accepts Medicaid and self-pay.  °[facility] [place] [address], [place] Phone: (336) 878-6027   °[facility] [address], [place], [place] (336)723-1848, Ext. 123 Mondays & Thursdays: 7-9 AM.  First 15 patients are seen on a first come, first serve basis. °  ° °Medicaid-accepting [place] Providers: ° °Organization         Address  Phone   Notes  °[facility] [address], [place] (336) 641-2100 Also accepts self-pay patients.  °[facility] [address], [place] ° (336) 856-9996   °[facility] [address], [place]  (336) 288-8857   °[facility] [address] [place] [address], [place] (336) 299-7000   °Veita Bland [address], [place]  ° (336) 373-1557 Only accepts  Access Medicaid patients after they have their name applied to their card.  ° °Self-Pay (no insurance) in [place]: ° °Organization         Address  Phone   Notes  °Sickle Cell Patients, [facility] [address], [place] (336) 832-1970   °[facility] [address], [place] (336) 832-4400   °[facility] ° [address], [place] (336) 992-4800   °[facility]/Dr. Osei-Bonsu ° [address] [place] [address], [place] or [address], [place] (336) 841-8500 Phone number for both [place] and [place] locations is the same.  °[facility] [address], [place] (336) 299-0000   °[facility] [address] [place] [address], [place] or [address] (336) 852-7530 °(336) 878-2260   °[facility] [address], [place] (336) 350-1642, phone; (336) 294-5005, fax Sees patients 1st and 3rd Saturday of every month.  Must not qualify for public or private insurance (i.e. Medicaid, Medicare, [place] Health Choice, Veterans' Benefits) • Household income should be no more than 200% of the poverty level •The clinic cannot treat you if you are pregnant or think you are pregnant • Sexually transmitted diseases are not treated at the clinic.  ° ° °Dental Care: °Organization         Address  Phone  Notes  °[place] [facility] [address], [place] (336) 641-6152 Accepts children up to age 21 who are enrolled in Medicaid or [place] Health Choice; pregnant women with a Medicaid card; and children who have applied for Medicaid or [place] Health Choice, but were declined, whose parents can pay a reduced fee at time of service.  °[place]  [facility] [place]  [address], [place] (336) 641-7733 Accepts children up to age 21 who are enrolled in Medicaid or [place] Health Choice; pregnant women with a Medicaid card; and children who have applied for Medicaid or [place] Health Choice, but were declined, whose parents can pay a reduced fee at time of service.  °[facility] ° [address], [place] (336) 641-4533 Patients are seen by appointment only. Walk-ins are not accepted. [facility] will see patients 18 years of age and older. °Monday - Tuesday (8am-5pm) °Most Wednesdays (8:30-5pm) °$30 per visit, cash only  °[facility] ° [address], [place] (336) 641-4533 Patients are seen by appointment only. Walk-ins are not accepted. [facility] will see patients 18 years of age and older. °One   Wednesday Evening (Monthly: Volunteer Based).  $30 per visit, cash only  °[facility]  (919) 537-3737 for adults; Children under age 4, call Graduate Pediatric Dentistry at (919) 537-3956. Children aged 4-14, please call (919) 537-3737 to request a pediatric application. ° Dental services are provided in all areas of dental care including fillings, crowns and bridges, complete and partial dentures, implants, gum treatment, root canals, and extractions. Preventive care is also provided. Treatment is provided to both adults and children. °Patients are selected via a lottery and there is often a waiting list. °  °[facility] [address], °[place] ° (336) 763-8833 www.drcivils.com °  °[facility] [address], [place], [place] (336)723-1848, Ext. 123 Second and Fourth Thursday of each month, opens at 6:30 AM; Clinic ends at 9 AM.  Patients are seen on a first-come first-served basis, and a limited number are seen during each clinic.  ° °[facility] ° [address], [place], [place] (336) 723-7904    Eligibility Requirements °You must have lived in [place], or [place] counties for at least the last three months. °  You cannot be eligible for state or federal sponsored healthcare insurance, including Veterans Administration, Medicaid, or Medicare. °  You generally cannot be eligible for healthcare insurance through your employer.  °  How to apply: °Eligibility screenings are held every Tuesday and Wednesday afternoon from 1:00 pm until 4:00 pm. You do not need an appointment for the interview!  °[facility] [address], [place], [place] 336-631-2330   °[facility]  336-342-8273   °[facility]  336-703-3100   °[facility]  336-570-6415   ° °Behavioral Health Resources in the Community: °Intensive Outpatient Programs °Organization         Address  Phone  Notes  °[facility] [address], [place], [place] 336-878-6098   °[facility] [address], [place], [place] 336-832-9800   °[facility] [address], [place], [place] ° 336-882-2125   °[facility] [address],  °[place], [place] 1-800-853-5163 or 336-641-4981   °Substance Abuse Resources °Organization         Address  Phone  Notes  °[facility]  336-882-2125   °[facility]  336-784-9470   °[facility]  336-285-9073   °[facility]  336-845-3988   °Residential & Outpatient Substance Abuse Program  1-800-659-3381   °Psychological Services °Organization         Address  Phone  Notes  °[facility]  336- 832-9600   °[facility]  336- 378-7881   °[facility] [address], [place] 1-800-853-5163 or 336-641-4981   ° °Mobile Crisis Teams °Organization         Address  Phone  Notes  °[facility], Mobile Crisis Care Unit  1-877-626-1772   °Assertive °[facility] ° [address]  [place], [place] 336-834-9664   °Sharon DeEsch [address] °[place] 336-554-5454   ° °Self-Help/Support Groups °Organization         Address  Phone             Notes  °[facility] - variety of support groups  336- 373-1402 Call for more information  °Narcotics Anonymous (NA), [facility] [address], °[place]  2 meetings at this location  ° °  Residential Treatment Programs °Organization         Address  Phone  Notes  °[facility] [address],    °[place]  1-866-801-8205   °[facility] ° [address], [place], [place] 704-293-8524   °[facility] [address], [place] 336-845-3988 Admissions: 8am-3pm M-F  °[facility] [address]    °[place], [place] 336-841-1104   °[facility] [address], [place], [place] 336-379-7146   °[facility] [address]  °[place], [place] 336-285-9073   °[facility] Intensive Outpatient [address], [place], [place] 336-852-3033   °[facility] [address]  °[place], [place] 1-877-615-2722 or 336-784-9470   °[facility] [address] [place], [place] 336-227-7417 Accepts Medicaid  °[facility] [address]  °[place] 1-800-659-3381 Substance Abuse/Addiction Treatment  ° °[place] Behavioral Health Resources °Organization         Address  Phone  Notes  °[facility]  (888) 581-9988   °Julie Brannon, PhD [address] [place], [place]   (336) 349-5553 or (336) 951-0000   °[facility]   [address] °[place], [place] (336) 349-4454   °[facility] [address], [place], [place] (336) 342-8316 Insurance/Medicaid/sponsorship through [facility]  °[facility] [address]                                    [place], [place] (336) 342-8316 Therapy/tele-psych/case    °[facility] [address]  ° [place], [place] (336) 349-2233    °Dr. Arfeen  (336) 349-4544   °[facility] [place]  [employer] [place] [facility] 1) [address],  °2) [address], [place] °3)  [address], [place] (336) 349-3220 °(336) 342-7768 ° °(336) 342-8140   °[place] [facility] (336) 342-1394 or (336) 342-3537 (After Hours)    ° ° °

## 2014-06-21 ENCOUNTER — Inpatient Hospital Stay (HOSPITAL_COMMUNITY)
Admission: AD | Admit: 2014-06-21 | Discharge: 2014-06-26 | DRG: 885 | Disposition: A | Discharge status: Home / Self Care | Payer: Federal, State, Local not specified - Other | Source: Intra-hospital | Attending: Psychiatry | Admitting: Psychiatry

## 2014-06-21 ENCOUNTER — Emergency Department (HOSPITAL_COMMUNITY)
Admission: EM | Admit: 2014-06-21 | Discharge: 2014-06-21 | Disposition: A | Discharge status: Other Acute Inpatient Hospital | Payer: Federal, State, Local not specified - Other | Attending: Emergency Medicine | Admitting: Emergency Medicine

## 2014-06-21 ENCOUNTER — Encounter (HOSPITAL_COMMUNITY): Payer: Self-pay | Admitting: *Deleted

## 2014-06-21 DIAGNOSIS — Z79899 Other long term (current) drug therapy: Secondary | ICD-10-CM | POA: Insufficient documentation

## 2014-06-21 DIAGNOSIS — F250 Schizoaffective disorder, bipolar type: Principal | ICD-10-CM | POA: Diagnosis present

## 2014-06-21 DIAGNOSIS — F419 Anxiety disorder, unspecified: Secondary | ICD-10-CM | POA: Diagnosis present

## 2014-06-21 DIAGNOSIS — F1220 Cannabis dependence, uncomplicated: Secondary | ICD-10-CM | POA: Diagnosis present

## 2014-06-21 DIAGNOSIS — F314 Bipolar disorder, current episode depressed, severe, without psychotic features: Secondary | ICD-10-CM | POA: Diagnosis present

## 2014-06-21 DIAGNOSIS — F3164 Bipolar disorder, current episode mixed, severe, with psychotic features: Secondary | ICD-10-CM | POA: Diagnosis present

## 2014-06-21 LAB — RAPID URINE DRUG SCREEN, HOSP PERFORMED
Amphetamines: NOT DETECTED
Barbiturates: NOT DETECTED
Benzodiazepines: NOT DETECTED
Cocaine: NOT DETECTED
Opiates: NOT DETECTED
Tetrahydrocannabinol: POSITIVE — AB

## 2014-06-21 LAB — CBC
HCT: 45 % (ref 39.0–52.0)
Hemoglobin: 15.1 g/dL (ref 13.0–17.0)
MCH: 31.5 pg (ref 26.0–34.0)
MCHC: 33.6 g/dL (ref 30.0–36.0)
MCV: 93.9 fL (ref 78.0–100.0)
PLATELETS: 198 10*3/uL (ref 150–400)
RBC: 4.79 MIL/uL (ref 4.22–5.81)
RDW: 13.6 % (ref 11.5–15.5)
WBC: 5.5 10*3/uL (ref 4.0–10.5)

## 2014-06-21 LAB — COMPREHENSIVE METABOLIC PANEL
ALBUMIN: 5.2 g/dL (ref 3.5–5.2)
ALK PHOS: 52 U/L (ref 39–117)
ALT: 45 U/L (ref 0–53)
AST: 25 U/L (ref 0–37)
Anion gap: 8 (ref 5–15)
BUN: 16 mg/dL (ref 6–23)
CHLORIDE: 101 mmol/L (ref 96–112)
CO2: 30 mmol/L (ref 19–32)
Calcium: 9.6 mg/dL (ref 8.4–10.5)
Creatinine, Ser: 0.93 mg/dL (ref 0.50–1.35)
GFR calc Af Amer: >90 mL/min (ref >90)
GFR calc non Af Amer: >90 mL/min (ref >90)
GLUCOSE: 92 mg/dL (ref 70–99)
Potassium: 4.4 mmol/L (ref 3.5–5.1)
Sodium: 139 mmol/L (ref 135–145)
TOTAL PROTEIN: 8.7 g/dL — AB (ref 6.0–8.3)
Total Bilirubin: 0.4 mg/dL (ref 0.3–1.2)

## 2014-06-21 LAB — ACETAMINOPHEN LEVEL: Acetaminophen (Tylenol), Serum: <10 ug/mL — ABNORMAL LOW (ref 10–30)

## 2014-06-21 LAB — ETHANOL: Alcohol, Ethyl (B): <5 mg/dL (ref 0–9)

## 2014-06-21 LAB — SALICYLATE LEVEL: Salicylate Lvl: <4 mg/dL (ref 2.8–20.0)

## 2014-06-21 MED ORDER — ACETAMINOPHEN 325 MG PO TABS: 650.0000 mg | ORAL_TABLET | Freq: Four times a day (QID) | ORAL | Status: DC | PRN

## 2014-06-21 MED ORDER — LORAZEPAM 1 MG PO TABS: 1.0000 mg | ORAL_TABLET | Freq: Three times a day (TID) | ORAL | Status: DC | PRN

## 2014-06-21 MED ORDER — ACETAMINOPHEN 325 MG PO TABS: 650.0000 mg | ORAL_TABLET | ORAL | Status: DC | PRN

## 2014-06-21 MED ORDER — OLANZAPINE 10 MG PO TBDP
10.0000 mg | ORAL_TABLET | Freq: Every day | ORAL | Status: DC
  Administered 2014-06-21 – 2014-06-22 (×2): 10 mg via ORAL
  Filled 2014-06-21 (×4): qty 1

## 2014-06-21 MED ORDER — TRAZODONE HCL 50 MG PO TABS: 50.0000 mg | ORAL_TABLET | Freq: Every evening | ORAL | Status: DC | PRN

## 2014-06-21 MED ORDER — MAGNESIUM HYDROXIDE 400 MG/5ML PO SUSP: 30.0000 mL | Freq: Every day | ORAL | Status: DC | PRN

## 2014-06-21 MED ORDER — TRAZODONE HCL 50 MG PO TABS
50.0000 mg | ORAL_TABLET | Freq: Every evening | ORAL | Status: DC | PRN
  Administered 2014-06-22 – 2014-06-25 (×4): 50 mg via ORAL
  Filled 2014-06-21: qty 1
  Filled 2014-06-21: qty 28
  Filled 2014-06-21 (×3): qty 1

## 2014-06-21 MED ORDER — IBUPROFEN 200 MG PO TABS: 600.0000 mg | ORAL_TABLET | Freq: Three times a day (TID) | ORAL | Status: DC | PRN

## 2014-06-21 MED ORDER — ALUM & MAG HYDROXIDE-SIMETH 200-200-20 MG/5ML PO SUSP: 30.0000 mL | ORAL | Status: DC | PRN

## 2014-06-21 MED ORDER — OLANZAPINE 10 MG PO TBDP: 10.0000 mg | ORAL_TABLET | Freq: Every day | ORAL | Status: DC

## 2014-06-21 MED ORDER — IBUPROFEN 600 MG PO TABS: 600.0000 mg | ORAL_TABLET | Freq: Three times a day (TID) | ORAL | Status: DC | PRN

## 2014-06-21 NOTE — ED Notes (Signed)
Bed: [id number] Expected date:  Expected time:  Means of arrival:  Comments: Tr 4

## 2014-06-21 NOTE — BH Assessment (Signed)
[facility] by Nanine MeansJamison Lord, NP and Dr. Jannifer FranklinAkintayo. Room assignment is 501-1. Nursing report [phone]. Support paperwork completed.

## 2014-06-21 NOTE — ED Notes (Signed)
md at bedside

## 2014-06-21 NOTE — ED Notes (Signed)
Acuity: resting with eyes closed.
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
## 2014-06-21 NOTE — Tx Team (Signed)
Initial Interdisciplinary Treatment Plan   PATIENT STRESSORS: Financial difficulties Occupational concerns   PATIENT STRENGTHS: Ability for insight Motivation for treatment/growth Supportive family/friends   PROBLEM LIST: Problem List/Patient Goals Date to be addressed Date deferred Reason deferred Estimated date of resolution  "Medication Management"      "Depression"                                                 DISCHARGE CRITERIA:  Ability to meet basic life and health needs Motivation to continue treatment in a less acute level of care  PRELIMINARY DISCHARGE PLAN: Attend aftercare/continuing care group Return to previous living arrangement  PATIENT/FAMIILY INVOLVEMENT: This treatment plan has been presented to and reviewed with the patient, Paul Brown, and/or family member.  The patient and family have been given the opportunity to ask questions and make suggestions.  Paul Brown, Paul Brown 06/21/2014, 7:54 PM

## 2014-06-21 NOTE — Progress Notes (Signed)
  CARE MANAGEMENT ED NOTE 06/21/2014  Patient:  Paul Brown,Paul E   Account Number:  [id number]  Date Initiated:  06/21/2014  Documentation initiated by:  Edd ArbourGIBBS,Kuron Docken  Subjective/Objective Assessment:   24 yr old self pay [place] pt reports he takes zyprexa for bipolar. Reports he needs a medical evaluation of his psych meds, denies SI/HI. Reports he hears voices, dx Depressive Disorder, Recurrent Severe, with Psychotic Features     Subjective/Objective Assessment Detail:   no pcp as confirmed by pt  Pt with covers over his head during CM entire assessment with a few responses as grunts and "yes"see below note     Action/Plan:   cm provided pt with list of [place] self pay resources   Action/Plan Detail:   Anticipated DC Date:       Status Recommendation to Physician:   Result of Recommendation:    Other ED Services  Consult Working Psychologist, educationallan    DC Planning Services  Other  Outpatient Services - Pt will follow up  PCP issues    Choice offered to / List presented to:            Status of service:  Completed, signed off  ED Comments:   ED Comments Detail:  CM spoke with pt who [place] [facility] [place]. CM discussed and provided written information for self pay pcps, importance of pcp for f/u care, www.needymeds.org, www.goodrx.com, discounted pharmacies and other [place] resources such as [facility], [employer], affordable care act,  financial assistance, DSS and  health department  Reviewed resources for [place] self pay pcps like [facility], [facility] [address], [facility], general medical clinics, [facility] plus others, medication resources, [facility] out patient pharmacies and housing Pt voiced understanding and appreciation of resources provided  Provided [facility] contact information not agreed to or completed by CM
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
## 2014-06-22 ENCOUNTER — Encounter (HOSPITAL_COMMUNITY): Payer: Self-pay | Admitting: Registered Nurse

## 2014-06-22 DIAGNOSIS — F250 Schizoaffective disorder, bipolar type: Secondary | ICD-10-CM | POA: Diagnosis present

## 2014-06-22 DIAGNOSIS — R440 Auditory hallucinations: Secondary | ICD-10-CM

## 2014-06-22 MED ORDER — CARBAMAZEPINE ER 100 MG PO TB12
100.0000 mg | ORAL_TABLET | Freq: Two times a day (BID) | ORAL | Status: DC
  Administered 2014-06-22 – 2014-06-26 (×8): 100 mg via ORAL
  Filled 2014-06-22 (×10): qty 1

## 2014-06-22 NOTE — [facility] Group Notes (Addendum)
[facility] Group Notes:  (Counselor/Nursing/MHT/Case Management/Adjunct)  06/22/2014 1:15PM  Type of Therapy:  Group Therapy  Participation Level:  Active  Participation Quality:  Appropriate  Affect:  Flat  Cognitive:  Oriented  Insight:  Improving  Engagement in Group:  Limited  Engagement in Therapy:  Limited  Modes of Intervention:  Discussion, Exploration and Socialization  Summary of Progress/Problems: The topic for group was balance in life.  Pt participated in the discussion about when their life was in balance and out of balance and how this feels.  Pt discussed ways to get back in balance and short term goals they can work on to get where they want to be. Shared that he struggles with a short fuse sometimes.  Gave the example that he sometimes stubbornly ignores others, which leads to confrontation.  Also worked in the theme of knowing that it is helpful to come to the hospital for help.  Thoughts were a bit jumbled-did not flow. Admitted that he went off of his medication a couple of weeks ago, and states he realizes now that was a bad idea. Called out to see the Dr.   [name], Paul Brown 06/22/2014 1:31 PM
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
## 2014-06-22 NOTE — [facility] Group Notes (Signed)
The focus of this group is to educate the patient on the purpose and policies of crisis stabilization and provide a format to answer questions about their admission.  The group details unit policies and expectations of patients while admitted.  Patient attended 0900 nurse education orientation group this morning.  Patient actively participated, appropriate affect, alert, appropriate insight and engagement.  Today patient will work on 3 goals for discharge.  

## 2014-06-22 NOTE — [facility] Suicide Risk Assessment (Signed)
[facility]   Nursing information obtained from:  Patient Demographic factors:  Male, Adolescent or young adult Current Mental Status:  NA Loss Factors:  NA Historical Factors:  NA Risk Reduction Factors:  Positive social support, Positive therapeutic relationship Total Time spent with patient: 30 minutes Principal Problem: Schizoaffective disorder, bipolar type Diagnosis:   Patient Active Problem List   Diagnosis Date Noted  . Schizoaffective disorder, bipolar type [F25.0] 06/22/2014     Continued Clinical Symptoms:  Alcohol Use Disorder Identification Test Final Score (AUDIT): 0 The "Alcohol Use Disorders Identification Test", Guidelines for Use in Primary Care, Second Edition.  World Science writerHealth Organization [facility](WHO). Score between 0-7:  no or low risk or alcohol related problems. Score between 8-15:  moderate risk of alcohol related problems. Score between 16-19:  high risk of alcohol related problems. Score 20 or above:  warrants further diagnostic evaluation for alcohol dependence and treatment.   CLINICAL FACTORS:   Currently Psychotic Unstable or Poor Therapeutic Relationship Previous Psychiatric Diagnoses and Treatments   Musculoskeletal: Strength & Muscle Tone: within normal limits Gait & Station: normal Patient leans: N/A  Psychiatric Specialty Exam: Physical Exam  Review of Systems  Psychiatric/Behavioral: Positive for depression and hallucinations. The patient has insomnia.     Blood pressure 133/71, pulse 103, temperature 98.6 F (37 C), resp. rate 18, height 5' 5.5" (1.664 m), weight 51.256 kg (113 lb).Body mass index is 18.51 kg/(m^2).  General Appearance: Fairly Groomed  Patent attorneyye Contact::  Good  Speech:  Clear and Coherent  Volume:  Normal  Mood:  Euphoric  Affect:  Labile  Thought Process:  Coherent  Orientation:  Full (Time, Place, and Person)  Thought Content:  Hallucinations: Auditory  Suicidal Thoughts:  No  Homicidal  Thoughts:  No  Memory:  Immediate;   Fair Recent;   Fair Remote;   Fair  Judgement:  Fair  Insight:  Fair  Psychomotor Activity:  Normal  Concentration:  Fair  Recall:  [employer]  Fund of Knowledge:Fair  Language: Fair  Akathisia:  No  Handed:  Right  AIMS (if indicated):     Assets:  Communication Skills Desire for Improvement  Sleep:     Cognition: WNL  ADL's:  Intact     COGNITIVE FEATURES THAT CONTRIBUTE TO RISK:  Closed-mindedness, Polarized thinking and Thought constriction (tunnel vision)    SUICIDE RISK:   Minimal: No identifiable suicidal ideation.  Patients presenting with no risk factors but with morbid ruminations; may be classified as minimal risk based on the severity of the depressive symptoms  PLAN OF CARE: Patient will benefit from inpatient treatment and stabilization.  Estimated length of stay is 5-7 days.  Reviewed past medical records,treatment plan.  Will continue to monitor vitals ,medication compliance and treatment side effects while patient is here.  Will monitor for medical issues as well as call consult as needed.  Reviewed labs ,will order as needed.  CSW will start working on disposition.  Patient to participate in therapeutic milieu .       Medical Decision Making:  Review of Psycho-Social Stressors (1), Review or order clinical lab tests (1), Established Problem, Worsening (2), Review of Last Therapy Session (1) and Review of New Medication or Change in Dosage (2)  I certify that inpatient services furnished can reasonably be expected to improve the patient's condition.   Makoa Satz MD 06/22/2014, 1:13 PM

## 2014-06-22 NOTE — Progress Notes (Signed)
D. Pt had been in bed for much of the evening, did not attend or participate in evening group activity. Pt did speak about being here for medications and depression and did receive hs medications without incident. Pt did not verbalize any complaints of pain this evening. A. Support and encouragement provided. R. Safety maintained, will continue to monitor.
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
## 2014-06-22 NOTE — [facility] Group Notes (Signed)
Adult Psychoeducational Group Note  Date:  06/22/2014 Time:  1:40 AM  Group Topic/Focus:  Wrap-Up Group:   The focus of this group is to help patients review their daily goal of treatment and discuss progress on daily workbooks.  Participation Level:  Did Not Attend  Participation Quality:  None  Affect:  None  Cognitive:  None  Insight: None  Engagement in Group:  None  Modes of Intervention:  Discussion  Additional Comments:  Paul Brown did not attend group.  Lillia AbedLindsay, Rebeccah Ivins A 06/22/2014, 1:40 AM

## 2014-06-22 NOTE — Progress Notes (Signed)
D. Pt had been up and visible in milieu this evening, had been seen interacting appropriately with staff and peers and did attend group activity this evening. Pt did speak of auditory hallucinations but reports that he is doing better and feeling ok. Pt did receive medications without incident and did request and receive PRN sleep medication as he reported he needed it to help with sleep. A. Support and encouragement provided. R. Safety maintained, will continue to monitor.
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
## 2014-06-22 NOTE — Progress Notes (Signed)
D:  Patient's self inventory sheet, patient slept good last night, no sleep medication given.  Good appetite, normal energy level, good concentration.  Denied depression, hopeless and anxiety.  Denied SI.  Denied pain.  Goal is to exercise and learn how to control himself and his emotions.  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  Thank you for helping me support system.  No discharge plans.  Will need group session after discharge. A:  Medications administered per MD orders.  Emotional support and encouragement given patient. R:  Denied SI and HI, contracts for safety.  Denied A/V hallucinations.  Safety maintained with 15 minute checks.   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  Plans to return to school.

## 2014-06-22 NOTE — Plan of Care (Signed)
Problem: Consults Goal: Depression Patient Education See Patient Education Module for education specifics.  Outcome: Completed/Met Date Met:  06/22/14 Nurse discussed depression with patient.

## 2014-06-23 ENCOUNTER — Other Ambulatory Visit: Payer: Self-pay

## 2014-06-23 DIAGNOSIS — F1220 Cannabis dependence, uncomplicated: Secondary | ICD-10-CM

## 2014-06-23 MED ORDER — OLANZAPINE 5 MG PO TBDP
15.0000 mg | ORAL_TABLET | Freq: Every day | ORAL | Status: DC
  Administered 2014-06-23 – 2014-06-25 (×3): 15 mg via ORAL
  Filled 2014-06-23 (×4): qty 3
  Filled 2014-06-23: qty 42

## 2014-06-23 NOTE — Progress Notes (Signed)
D: Patient is alert and oriented. Pt's mood and affect is pleasant, "good," and animated. Pt denies SI/HI and AVH. Pt rates depression, hopelessness, and anxiety 0/10. Pt reports his goal for the day is "just being patient and learning about my medical condition. Pt is attending unit groups. A: EKG completed. Active listening by RN. Encouragement/Support provided to pt. Scheduled medications administered per providers orders (See MAR). 15 minute checks continued per protocol for patient safety.  R: Patient cooperative and receptive to nursing interventions. Pt remains safe.

## 2014-06-23 NOTE — [facility] Group Notes (Signed)
[facility]   06/23/2014 10:59 AM  Participation Quality:  Active   Mood/Affect:  Appropriate  Depression Rating:  0  Anxiety Rating:  4  Thoughts of Suicide:  No Will you contract for safety?   NA  Current AVH:  No  Plan for Discharge/Comments:  Paul Brown states he slept well last night. He stated he enjoyed karaoke last night. He plans to return home and [facility].   Transportation Means: Bus   Supports:Family   Hyatt,Candace

## 2014-06-23 NOTE — Progress Notes (Signed)
D.  Pt pleasant on approach, denies complaints at this time.  Positive for evening wrap up group, interacting appropriately with peers on the unit.  Denies SI/HI/hallucinations at this time.  A.  Support and encouragement offered  R.  Pt remains safe on the unit, will continue to monitor.  

## 2014-06-23 NOTE — Progress Notes (Signed)
[facility]  06/23/2014 12:52 PM Corinna LinesDwaine E Aslin  MRN:  [id number] Subjective: Patient states " I am ok, I am taking my medications.' Objective:Patient seen and chart reviewed.Pt with hx of schizoaffective disorder, was noncompliant on medications , presented after being aggressive at home as well as was actively hallucinating. Pt continues to be hyperactive , labile moods and has AH , but reports they are reducing.Pt wants to work on his relational issues as well as anger issues, regrets what he did prior to coming in. Pt seen on the unit as very hyperactive , interactive with staff, is compliant on medications. Denies side effects.   Principal Problem: Schizoaffective disorder, bipolar type Diagnosis:   Patient Active Problem List   Diagnosis Date Noted  . Cannabis use disorder, moderate, dependence [F12.20] 06/23/2014  . Schizoaffective disorder, bipolar type [F25.0] 06/22/2014   Total Time spent with patient: 30 minutes   Past Medical History:  Past Medical History  Diagnosis Date  . Mood disorder anxiety  . Anxiety depression  . Depression    History reviewed. No pertinent past surgical history. Family History: History reviewed. No pertinent family history. Social History:  History  Alcohol Use No     History  Drug Use No    History   Social History  . Marital Status: Single    Spouse Name: N/A  . Number of Children: N/A  . Years of Education: N/A   Social History Main Topics  . Smoking status: Current Every Day Smoker -- 0.50 packs/day  . Smokeless tobacco: Never Used  . Alcohol Use: No  . Drug Use: No  . Sexual Activity: Yes    Birth Control/ Protection: Condom   Other Topics Concern  . None   Social History Narrative   Additional History:    Sleep: Fair  Appetite:  Fair     Musculoskeletal: Strength & Muscle Tone: within normal limits Gait & Station: normal Patient leans: N/A   Psychiatric Specialty Exam: Physical Exam  Review of  Systems  Psychiatric/Behavioral: Positive for hallucinations. Negative for depression. The patient is nervous/anxious.     Blood pressure 142/103, pulse 109, temperature 97.5 F (36.4 C), temperature source Oral, resp. rate 16, height 5' 5.5" (1.664 m), weight 51.256 kg (113 lb).Body mass index is 18.51 kg/(m^2).  General Appearance: Fairly Groomed  Patent attorneyye Contact::  Good  Speech:  Pressured  Volume:  Normal  Mood:  Euphoric  Affect:  Labile  Thought Process:  Coherent  Orientation:  Full (Time, Place, and Person)  Thought Content:  Hallucinations: Auditory  Suicidal Thoughts:  No  Homicidal Thoughts:  No  Memory:  Immediate;   Fair Recent;   Fair Remote;   Fair  Judgement:  Impaired  Insight:  Shallow  Psychomotor Activity:  Restlessness  Concentration:  Fair  Recall:  FiservFair  Fund of Knowledge:Fair  Language: Fair  Akathisia:  No  Handed:  Right  AIMS (if indicated):     Assets:  Communication Skills Desire for Improvement  ADL's:  Intact  Cognition: WNL  Sleep:        Current Medications: Current Facility-Administered Medications  Medication Dose Route Frequency Provider Last Rate Last Dose  . acetaminophen (TYLENOL) tablet 650 mg  650 mg Oral Q6H PRN Charm RingsJamison Y Lord, NP      . alum & mag hydroxide-simeth (MAALOX/MYLANTA) 200-200-20 MG/5ML suspension 30 mL  30 mL Oral Q4H PRN Charm RingsJamison Y Lord, NP      . carbamazepine (TEGRETOL XR) 12  hr tablet 100 mg  100 mg Oral BID Jomarie Longs, MD   100 mg at 06/23/14 0836  . magnesium hydroxide (MILK OF MAGNESIA) suspension 30 mL  30 mL Oral Daily PRN Charm Rings, NP      . OLANZapine zydis (ZYPREXA) disintegrating tablet 10 mg  10 mg Oral QHS Charm Rings, NP   10 mg at 06/22/14 2136  . traZODone (DESYREL) tablet 50 mg  50 mg Oral QHS PRN,MR X 1 Charm Rings, NP   50 mg at 06/22/14 2137    Lab Results: No results found for this or any previous visit (from the past 48 hour(s)).  Physical Findings: AIMS: Facial and Oral  Movements Muscles of Facial Expression: None, normal Lips and Perioral Area: None, normal Jaw: None, normal Tongue: None, normal,Extremity Movements Upper (arms, wrists, hands, fingers): None, normal Lower (legs, knees, ankles, toes): None, normal, Trunk Movements Neck, shoulders, hips: None, normal, Overall Severity Severity of abnormal movements (highest score from questions above): None, normal Incapacitation due to abnormal movements: None, normal Patient's awareness of abnormal movements (rate only patient's report): No Awareness, Dental Status Current problems with teeth and/or dentures?: No Does patient usually wear dentures?: No  CIWA:  CIWA-Ar Total: 1 COWS:  COWS Total Score: 2   Assessment: Patient is a 20 y old AAM with hx of schizoaffective disorder , presented with mood lability as well as psychosis. Pt has been improving with regards to his AH , but continues to be hyperactive . Will need medication management.  Treatment Plan Summary: Daily contact with patient to assess and evaluate symptoms and progress in treatment and Medication management Will continue Tegretol XR 100 mg po bid for mood lability. Increase Zyprexa to 15 mg po at bedtime. Will continue to monitor vitals ,medication compliance and treatment side effects while patient is here.  Will monitor for medical issues as well as call consult as needed.  Reviewed labs . CSW will start working on disposition.  Patient to participate in therapeutic milieu    Medical Decision Making:  New problem, with additional work up planned, Review of Psycho-Social Stressors (1), Review or order clinical lab tests (1), Order AIMS Test (2), Review of Last Therapy Session (1), Review or order medicine tests (1) and Review of Medication Regimen & Side Effects (2)     Jordanny Waddington MD 06/23/2014, 12:52 PM

## 2014-06-23 NOTE — [facility] Group Notes (Signed)
[facility] LCSW Group Therapy  06/23/2014 1:18 PM  Type of Therapy:  Group Therapy  Participation Level:  Active  Participation Quality:  Appropriate  Affect:  Appropriate  Cognitive:  Alert  Insight:  Improving  Engagement in Therapy:  Engaged  Modes of Intervention:  Discussion, Socialization and Support  Summary of Progress/Problems: The chaplin facilitated group about community. " A community are people you can rely on." He feels supported by his pastor and neighbors. He does not like to ask his parents for help because he feels like he is supposed to be an adult.    Hyatt,Candace 06/23/2014, 1:18 PM

## 2014-06-24 DIAGNOSIS — F250 Schizoaffective disorder, bipolar type: Principal | ICD-10-CM

## 2014-06-24 LAB — LIPID PANEL
Cholesterol: 169 mg/dL (ref 0–200)
HDL: 62 mg/dL (ref >39)
LDL Cholesterol: 91 mg/dL (ref 0–99)
Total CHOL/HDL Ratio: 2.7 RATIO
Triglycerides: 78 mg/dL (ref <150)
VLDL: 16 mg/dL (ref 0–40)

## 2014-06-24 NOTE — Progress Notes (Signed)
The focus of this group is to help patients review their daily goal of treatment and discuss progress on daily workbooks. Pt attended the evening group session and responded to all discussion prompts from the Writer. Pt shared that today was a good day, though he had hoped to go to the gym. "It would help me to be able to play ball or something to get this energy out." Pt's only additional request from Nursing Staff this evening was to be allowed to shave, which the Writer agreed to assist with following wrap-up. Pt told the group that upon discharge he wanted to continue in his care plan in order to keep well and that he wanted to follow one of his passions, either art or physical therapy. Pt's affect was appropriate and he volunteered several encouraging comments to his peers.

## 2014-06-24 NOTE — Progress Notes (Signed)
D.  Pt pleasant and bright on approach, denies complaints at this time.  Positive for evening wrap up group (see group notes), with appropriate participation.  Interacting appropriately with peers on the unit.  Denies SI/HI/hallucinations at this time.  A.  Support and encouragement offered, medication given as ordered  R.  Pt remains safe on unit, will continue to monitor.

## 2014-06-24 NOTE — Progress Notes (Signed)
[facility] MD Progress Note  06/24/2014 12:13 PM DELMAN GOSHORN  MRN:  [id number] Subjective: Patient states "I am positive"' States meds are helping. He tried going the natural way prior to admission and it didn't work. Taking meds as prescribed and denies SE. Sleep, appetite and energy are good. Denies depression and hopelessness. Denies SI/HI, AVH. He was able to control his temper with his mom on the phone yesterday.   Objective:Patient seen and chart reviewed.Pt with hx of schizoaffective disorder, was noncompliant on medications, presented after being aggressive at home as well as was actively hallucinating. Pt continues to be hyperactive with some labile moods. No longer reporting AH. Pt wants to work on his relational issues as well as anger issues, regrets what he did prior to coming in. Pt seen on the unit as very hyperactive , interactive with staff, is compliant on medications. Denies side effects.   Principal Problem: Schizoaffective disorder, bipolar type Diagnosis:   Patient Active Problem List   Diagnosis Date Noted  . Cannabis use disorder, moderate, dependence [F12.20] 06/23/2014  . Schizoaffective disorder, bipolar type [F25.0] 06/22/2014   Total Time spent with patient: 30 minutes   Past Medical History:  Past Medical History  Diagnosis Date  . Mood disorder anxiety  . Anxiety depression  . Depression    History reviewed. No pertinent past surgical history. Family History: History reviewed. No pertinent family history. Social History:  History  Alcohol Use No     History  Drug Use No    History   Social History  . Marital Status: Single    Spouse Name: N/A  . Number of Children: N/A  . Years of Education: N/A   Social History Main Topics  . Smoking status: Current Every Day Smoker -- 0.50 packs/day  . Smokeless tobacco: Never Used  . Alcohol Use: No  . Drug Use: No  . Sexual Activity: Yes    Birth Control/ Protection: Condom   Other Topics Concern  .  None   Social History Narrative   Additional History:    Sleep: Fair  Appetite:  Fair     Musculoskeletal: Strength & Muscle Tone: within normal limits Gait & Station: normal Patient leans: N/A   Psychiatric Specialty Exam: Physical Exam   Review of Systems  HENT: Negative for sore throat.   Eyes: Negative for blurred vision, double vision and pain.  Respiratory: Negative for cough.   Cardiovascular: Negative for chest pain and leg swelling.  Gastrointestinal: Negative for heartburn, nausea and vomiting.  Musculoskeletal: Negative for back pain, joint pain and neck pain.  Skin: Negative for itching and rash.  Neurological: Negative for dizziness, loss of consciousness, weakness and headaches.  Psychiatric/Behavioral: Negative for depression, suicidal ideas, hallucinations and substance abuse. The patient is not nervous/anxious and does not have insomnia.     Blood pressure 133/92, pulse 106, temperature 97.8 F (36.6 C), temperature source Oral, resp. rate 18, height 5' 5.5" (1.664 m), weight 51.256 kg (113 lb).Body mass index is 18.51 kg/(m^2).  General Appearance: Fairly Groomed  Patent attorney::  Good  Speech:  pushed-mild  Volume:  Normal  Mood:  Euphoric  Affect:  elevated  Thought Process:  Coherent  Orientation:  Full (Time, Place, and Person)  Thought Content:  Negative  Suicidal Thoughts:  No  Homicidal Thoughts:  No  Memory:  Immediate;   Fair Recent;   Fair Remote;   Fair  Judgement:  Impaired  Insight:  Shallow  Psychomotor Activity:  Normal  Concentration:  Fair  Recall:  [employer] of Knowledge:Fair  Language: Fair  Akathisia:  No  Handed:  Right  AIMS (if indicated):     Assets:  Communication Skills Desire for Improvement  ADL's:  Intact  Cognition: WNL  Sleep:        Current Medications: Current Facility-Administered Medications  Medication Dose Route Frequency Provider Last Rate Last Dose  . acetaminophen (TYLENOL) tablet 650 mg   650 mg Oral Q6H PRN Charm Rings, NP      . alum & mag hydroxide-simeth (MAALOX/MYLANTA) 200-200-20 MG/5ML suspension 30 mL  30 mL Oral Q4H PRN Charm Rings, NP      . carbamazepine (TEGRETOL XR) 12 hr tablet 100 mg  100 mg Oral BID Jomarie Longs, MD   100 mg at 06/24/14 1002  . magnesium hydroxide (MILK OF MAGNESIA) suspension 30 mL  30 mL Oral Daily PRN Charm Rings, NP      . OLANZapine zydis (ZYPREXA) disintegrating tablet 15 mg  15 mg Oral QHS Jomarie Longs, MD   15 mg at 06/23/14 2044  . traZODone (DESYREL) tablet 50 mg  50 mg Oral QHS PRN,MR X 1 Charm Rings, NP   50 mg at 06/23/14 2044    Lab Results:  Results for orders placed or performed during the hospital encounter of 06/21/14 (from the past 48 hour(s))  Lipid panel     Status: None   Collection Time: 06/24/14  6:45 AM  Result Value Ref Range   Cholesterol 169 0 - 200 mg/dL   Triglycerides 78 <161 mg/dL   HDL 62 >09 mg/dL   Total CHOL/HDL Ratio 2.7 RATIO   VLDL 16 0 - 40 mg/dL   LDL Cholesterol 91 0 - 99 mg/dL    Comment:        Total Cholesterol/HDL:CHD Risk Coronary Heart Disease Risk Table                     Men   Women  1/2 Average Risk   3.4   3.3  Average Risk       5.0   4.4  2 X Average Risk   9.6   7.1  3 X Average Risk  23.4   11.0        Use the calculated Patient Ratio above and the CHD Risk Table to determine the patient's CHD Risk.        ATP III CLASSIFICATION (LDL):  <100     mg/dL   Optimal  604-540  mg/dL   Near or Above                    Optimal  130-159  mg/dL   Borderline  981-191  mg/dL   High  >478     mg/dL   Very High Performed at [facility]     Physical Findings: AIMS: Facial and Oral Movements Muscles of Facial Expression: None, normal Lips and Perioral Area: None, normal Jaw: None, normal Tongue: None, normal,Extremity Movements Upper (arms, wrists, hands, fingers): None, normal Lower (legs, knees, ankles, toes): None, normal, Trunk Movements Neck,  shoulders, hips: None, normal, Overall Severity Severity of abnormal movements (highest score from questions above): None, normal Incapacitation due to abnormal movements: None, normal Patient's awareness of abnormal movements (rate only patient's report): No Awareness, Dental Status Current problems with teeth and/or dentures?: No Does patient usually wear dentures?: No  CIWA:  CIWA-Ar Total: 1 COWS:  COWS Total Score: 2   Assessment: Patient is a [age] y old AAM with hx of schizoaffective disorder , presented with mood lability as well as psychosis. Pt has been improving with regards to his AH , but continues to be hyperactive . Will need medication management.  Treatment Plan Summary: Daily contact with patient to assess and evaluate symptoms and progress in treatment and Medication management Will continue Tegretol XR 100 mg po bid for mood lability. Zyprexa 15 mg po at bedtime. Will continue to monitor vitals ,medication compliance and treatment side effects while patient is here.  Will monitor for medical issues as well as call consult as needed.  Reviewed labs . CSW will start working on disposition.  Patient to participate in therapeutic milieu    Medical Decision Making:  Review of Psycho-Social Stressors (1), Review or order clinical lab tests (1) and Review of Medication Regimen & Side Effects (2)   Oletta DarterAGARWAL, Dmauri Rosenow MD 06/24/2014, 12:13 PM

## 2014-06-24 NOTE — Progress Notes (Signed)
Patient ID: Paul Brown, male   DOB: 07/23/1990, 23 y.o.   MRN: [id number]   D: Pt has been appropriate on the unit today, he attended all groups and engaged in treatment. Pt took all medications as prescribed by the doctor. Pt reported that his depression was a 0, his hopelessness was a 0, and that his anxiety was a 0. Pt reported that his goal was just to maintain. Pt reported being negative SI/HI, no AH/VH noted. A: 15 min checks continued for patient safety. R: Pt safety maintained.   

## 2014-06-24 NOTE — [facility] Group Notes (Signed)
[facility] Group Notes:  (Clinical Social Work)  06/24/2014  11:15-12:00PM  Summary of Progress/Problems:   The main focus of today's process group was to discuss patients' feelings about hospitalization, the stigma attached to mental health, and sources of motivation to stay well.  We then worked to identify a specific plan to avoid future hospitalizations when discharged from the hospital for this admission.  The patient expressed that he is having a very good day, and stated that on his first admission to this hospital, he was resistant to treatment because of fears of hospitals.  He stated this is due to observing his grandfather's medical needs his whole life after serving in the [employer], and he always felt that the hospital did not fix his grandfather like it should have.  Paul Brown participated well in group, was supportive of others and was topic-focused and goal-oriented.  Type of Therapy:  Group Therapy - Process  Participation Level:  Active  Participation Quality:  Appropriate, Attentive, Sharing and Supportive  Affect:  Appropriate  Cognitive:  Appropriate and Oriented  Insight:  Engaged  Engagement in Therapy:  Engaged  Modes of Intervention:  Exploration, Discussion  Ambrose MantleMareida Grossman-Orr, LCSW 06/24/2014, 1:02 PM

## 2014-06-25 NOTE — Progress Notes (Signed)
Patient ID: [name], male   DOB: Apr 25, 1990, 24 y.o.   MRN: [id number]   D: Pt has been appropriate on the unit today, he attended all groups and engaged in treatment. Pt took all medications as prescribed by the doctor. Pt reported that his depression was a 0, his hopelessness was a 0, and that his anxiety was a 0. Pt reported that his goal was just to maintain. Pt reported being negative SI/HI, no AH/VH noted. A: 15 min checks continued for patient safety. R: Pt safety maintained.

## 2014-06-25 NOTE — [facility] Group Notes (Signed)
The focus of this group is to educate the patient on the purpose and policies of crisis stabilization and provide a format to answer questions about their admission.  The group details unit policies and expectations of patients while admitted.  Patient did not attend 0900 nurse education orientation group this morning.  Patient stayed in bed.   

## 2014-06-25 NOTE — Progress Notes (Signed)
[facility] MD Progress Note  06/25/2014 12:40 PM PRABHAV FAULKENBERRY  MRN:  [id number] Subjective: Patient states "I am good"' No concerns today. Mom visited yesterday and he was happy. They got along well.  States meds are helping. Taking meds as prescribed and denies SE. Sleep, appetite and energy are good. Denies depression (0/10) and hopelessness (0/10). Anxiety is slowly decreasing. Denies SI/HI, AVH.    Objective:Patient seen and chart reviewed.Pt with hx of schizoaffective disorder, was noncompliant on medications, presented after being aggressive at home as well as was actively hallucinating. Pt seen on the unit as cooperative and pleasent, interactive with staff, is compliant on medications. Denies side effects. Pt sen playing cards with other pt's in teh dayroom.    Principal Problem: Schizoaffective disorder, bipolar type Diagnosis:   Patient Active Problem List   Diagnosis Date Noted  . Cannabis use disorder, moderate, dependence [F12.20] 06/23/2014  . Schizoaffective disorder, bipolar type [F25.0] 06/22/2014   Total Time spent with patient: 30 minutes   Past Medical History:  Past Medical History  Diagnosis Date  . Mood disorder anxiety  . Anxiety depression  . Depression    History reviewed. No pertinent past surgical history. Family History: History reviewed. No pertinent family history. Social History:  History  Alcohol Use No     History  Drug Use No    History   Social History  . Marital Status: Single    Spouse Name: N/A  . Number of Children: N/A  . Years of Education: N/A   Social History Main Topics  . Smoking status: Current Every Day Smoker -- 0.50 packs/day  . Smokeless tobacco: Never Used  . Alcohol Use: No  . Drug Use: No  . Sexual Activity: Yes    Birth Control/ Protection: Condom   Other Topics Concern  . None   Social History Narrative   Additional History:    Sleep: Fair  Appetite:  Fair     Musculoskeletal: Strength & Muscle  Tone: within normal limits Gait & Station: normal Patient leans: N/A   Psychiatric Specialty Exam: Physical Exam  Psychiatric: He has a normal mood and affect. His speech is normal and behavior is normal. Judgment and thought content normal. Cognition and memory are normal.    Review of Systems  Musculoskeletal: Negative.   Neurological: Negative for dizziness, tremors, seizures and loss of consciousness.  Psychiatric/Behavioral: Negative.     Blood pressure 135/80, pulse 113, temperature 98.1 F (36.7 C), temperature source Oral, resp. rate 18, height 5' 5.5" (1.664 m), weight 51.256 kg (113 lb).Body mass index is 18.51 kg/(m^2).  General Appearance: Fairly Groomed  Patent attorney::  Good  Speech:  Clear and Coherent  Volume:  Normal  Mood:  Euthymic  Affect:  Appropriate  Thought Process:  Coherent  Orientation:  Full (Time, Place, and Person)  Thought Content:  Negative  Suicidal Thoughts:  No  Homicidal Thoughts:  No  Memory:  Immediate;   Fair Recent;   Fair Remote;   Fair  Judgement:  Fair  Insight:  Shallow  Psychomotor Activity:  Normal  Concentration:  Fair  Recall:  [employer] of Knowledge:Fair  Language: Fair  Akathisia:  No  Handed:  Right  AIMS (if indicated):     Assets:  Communication Skills Desire for Improvement  ADL's:  Intact  Cognition: WNL  Sleep:        Current Medications: Current Facility-Administered Medications  Medication Dose Route Frequency Provider Last Rate Last Dose  .  acetaminophen (TYLENOL) tablet 650 mg  650 mg Oral Q6H PRN Charm RingsJamison Y Lord, NP      . alum & mag hydroxide-simeth (MAALOX/MYLANTA) 200-200-20 MG/5ML suspension 30 mL  30 mL Oral Q4H PRN Charm RingsJamison Y Lord, NP      . carbamazepine (TEGRETOL XR) 12 hr tablet 100 mg  100 mg Oral BID Jomarie LongsSaramma Eappen, MD   100 mg at 06/25/14 0941  . magnesium hydroxide (MILK OF MAGNESIA) suspension 30 mL  30 mL Oral Daily PRN Charm RingsJamison Y Lord, NP      . OLANZapine zydis (ZYPREXA) disintegrating  tablet 15 mg  15 mg Oral QHS Jomarie LongsSaramma Eappen, MD   15 mg at 06/24/14 2103  . traZODone (DESYREL) tablet 50 mg  50 mg Oral QHS PRN,MR X 1 Charm RingsJamison Y Lord, NP   50 mg at 06/24/14 2103    Lab Results:  Results for orders placed or performed during the hospital encounter of 06/21/14 (from the past 48 hour(s))  Lipid panel     Status: None   Collection Time: 06/24/14  6:45 AM  Result Value Ref Range   Cholesterol 169 0 - 200 mg/dL   Triglycerides 78 <045<150 mg/dL   HDL 62 >40>39 mg/dL   Total CHOL/HDL Ratio 2.7 RATIO   VLDL 16 0 - 40 mg/dL   LDL Cholesterol 91 0 - 99 mg/dL    Comment:        Total Cholesterol/HDL:CHD Risk Coronary Heart Disease Risk Table                     Men   Women  1/2 Average Risk   3.4   3.3  Average Risk       5.0   4.4  2 X Average Risk   9.6   7.1  3 X Average Risk  23.4   11.0        Use the calculated Patient Ratio above and the CHD Risk Table to determine the patient's CHD Risk.        ATP III CLASSIFICATION (LDL):  <100     mg/dL   Optimal  981-191100-129  mg/dL   Near or Above                    Optimal  130-159  mg/dL   Borderline  478-295160-189  mg/dL   High  >621>190     mg/dL   Very High [facility]     Physical Findings: AIMS: Facial and Oral Movements Muscles of Facial Expression: None, normal Lips and Perioral Area: None, normal Jaw: None, normal Tongue: None, normal,Extremity Movements Upper (arms, wrists, hands, fingers): None, normal Lower (legs, knees, ankles, toes): None, normal, Trunk Movements Neck, shoulders, hips: None, normal, Overall Severity Severity of abnormal movements (highest score from questions above): None, normal Incapacitation due to abnormal movements: None, normal Patient's awareness of abnormal movements (rate only patient's report): No Awareness, Dental Status Current problems with teeth and/or dentures?: No Does patient usually wear dentures?: No  CIWA:  CIWA-Ar Total: 1 COWS:  COWS Total Score:  2   Assessment: Patient is a [age] y old AAM with hx of schizoaffective disorder , presented with mood lability as well as psychosis. Pt has been improving with regards to his AH , but continues to be hyperactive . Will need medication management.  Treatment Plan Summary: Daily contact with patient to assess and evaluate symptoms and progress in treatment and Medication management Will  continue Tegretol XR 100 mg po bid for mood lability. Zyprexa 15 mg po at bedtime. Will continue to monitor vitals ,medication compliance and treatment side effects while patient is here.  Will monitor for medical issues as well as call consult as needed.  Reviewed labs . CSW will start working on disposition. Pt may be appropriate for d/c early this week.  Patient to participate in therapeutic milieu    Medical Decision Making:  Review of Psycho-Social Stressors (1), Review or order clinical lab tests (1) and Review of Medication Regimen & Side Effects (2)   Oletta Darter MD 06/25/2014, 12:40 PM

## 2014-06-25 NOTE — Progress Notes (Signed)
D.  Pt pleasant on approach, denies complaints at this time.  Positive for evening wrap up group, with appropriate participation.  Interacting appropriately with peers on the unit.  Denies SI/HI/hallucinations at this time.  A.  Support and encouragement offered  R.  Pt remains safe on unit, will continue to monitor.

## 2014-06-25 NOTE — [facility] Group Notes (Signed)
[facility] Group Notes:  (Clinical Social Work)  06/25/2014   11:15am-12:00pm  Summary of Progress/Problems:  The main focus of today's process group was to listen to a variety of genres of music and to identify that different types of music provoke different responses.  The patient then was able to identify personally what was soothing for them, as well as energizing.  Handouts were used to record feelings evoked, as well as how patient can personally use this knowledge in sleep habits, with depression, and with other symptoms.  The patient expressed understanding of concepts, as well as knowledge of how each type of music affected him/her and how this can be used at home as a wellness/recovery tool.  He was fully engaged throughout group, did the associated paperwork and discussed his feelings numerous times.  He danced and sang quite a bit, and was smiling and laughing.  Type of Therapy:  Music Therapy   Participation Level:  Active  Participation Quality:  Attentive and Sharing  Affect:  Excited  Cognitive:  Oriented  Insight:  Engaged  Engagement in Therapy:  Engaged  Modes of Intervention:   Activity, Exploration  Ambrose MantleMareida Grossman-Orr, LCSW 06/25/2014, 12:30pm

## 2014-06-26 LAB — HEMOGLOBIN A1C
Hgb A1c MFr Bld: 5.9 % — ABNORMAL HIGH (ref 4.8–5.6)
Mean Plasma Glucose: 123 mg/dL

## 2014-06-26 MED ORDER — IBUPROFEN 600 MG PO TABS: 600.0000 mg | ORAL_TABLET | Freq: Four times a day (QID) | ORAL | Status: DC | PRN

## 2014-06-26 MED ORDER — CARBAMAZEPINE 100 MG PO CHEW
100.0000 mg | CHEWABLE_TABLET | Freq: Two times a day (BID) | ORAL | Status: DC
  Filled 2014-06-26 (×4): qty 1

## 2014-06-26 MED ORDER — CHLORPHENIRAMINE MALEATE 4 MG PO TABS: 4.0000 mg | ORAL_TABLET | Freq: Four times a day (QID) | ORAL | Status: DC | PRN

## 2014-06-26 MED ORDER — NICOTINE 14 MG/24HR TD PT24: 14.0000 mg | MEDICATED_PATCH | Freq: Every day | TRANSDERMAL | Status: DC

## 2014-06-26 MED ORDER — CARBAMAZEPINE 100 MG PO CHEW: 100.0000 mg | CHEWABLE_TABLET | Freq: Two times a day (BID) | ORAL | Status: DC

## 2014-06-26 MED ORDER — NICOTINE 14 MG/24HR TD PT24
14.0000 mg | MEDICATED_PATCH | Freq: Every day | TRANSDERMAL | Status: DC
  Administered 2014-06-26: 14 mg via TRANSDERMAL
  Filled 2014-06-26 (×2): qty 1

## 2014-06-26 MED ORDER — CARBAMAZEPINE ER 100 MG PO TB12: 100.0000 mg | ORAL_TABLET | Freq: Two times a day (BID) | ORAL | Status: DC

## 2014-06-26 MED ORDER — OLANZAPINE 15 MG PO TBDP: 15.0000 mg | ORAL_TABLET | Freq: Every day | ORAL | Status: DC

## 2014-06-26 MED ORDER — TRAZODONE HCL 50 MG PO TABS: 50.0000 mg | ORAL_TABLET | Freq: Every evening | ORAL | Status: DC | PRN

## 2014-06-26 NOTE — [facility] Suicide Risk Assessment (Signed)
[facility] INPATIENT:  Family/Significant Other Suicide Prevention Education  Suicide Prevention Education:  Education Completed; No one has been identified by the patient as the family member/significant other with whom the patient will be residing, and identified as the person(s) who will aid the patient in the event of a mental health crisis (suicidal ideations/suicide attempt).  With written consent from the patient, the family member/significant other has been provided the following suicide prevention education, prior to the and/or following the discharge of the patient.  The suicide prevention education provided includes the following:  Suicide risk factors  Suicide prevention and interventions  National Suicide Hotline telephone number  [facility] assessment telephone number  [facility] Emergency Assistance 911  [facility] and/or Residential Mobile Crisis Unit telephone number  Request made of family/significant other to:  Remove weapons (e.g., guns, rifles, knives), all items previously/currently identified as safety concern.    Remove drugs/medications (over-the-counter, prescriptions, illicit drugs), all items previously/currently identified as a safety concern.  The family member/significant other verbalizes understanding of the suicide prevention education information provided.  The family member/significant other agrees to remove the items of safety concern listed above. The patient did not endorse SI at the time of admission, nor did the patient c/o SI during the stay here.  SPE not required.  However, I did talk his mother, Harlene Ramusikki Simpson at [phone] who wanted to make sure all his meds were affordable and that he was willing to take meds when he leaves us.  Daryel Geraldorth, Gennie Eisinger B 06/26/2014, 10:38 AM

## 2014-06-26 NOTE — Progress Notes (Signed)
Adult Psychoeducational Group Note  Date:  06/26/2014 Time:  1:37 PM  Group Topic/Focus:  Developing a Wellness Toolbox:   The focus of this group is to help patients develop a "wellness toolbox" with skills and strategies to promote recovery upon discharge.  Participation Level:  Active  Participation Quality:  Appropriate and Sharing  Affect:  Appropriate  Cognitive:  Alert and Appropriate  Insight: Good  Engagement in Group:  Engaged  Modes of Intervention:  Discussion and Education  Additional Comments:  Pt was able to share positively with the group this morning. Pt stated "I'm just glad that I get to go home today, I can't wait."  Rin Gorton E 06/26/2014, 1:37 PM

## 2014-06-26 NOTE — Tx Team (Signed)
  Interdisciplinary Treatment Plan Update   Date Reviewed:  06/26/2014  Time Reviewed:  10:34 AM  Progress in Treatment:   Attending groups: Yes Participating in groups: Yes Taking medication as prescribed: Yes  Tolerating medication: Yes Family/Significant other contact made: Left msg this AM  Patient understands diagnosis: Yes  Discussing patient identified problems/goals with staff: Yes  See initial care plan Medical problems stabilized or resolved: Yes Denies suicidal/homicidal ideation: Yes  In tx team Patient has not harmed self or others: Yes  For review of initial/current patient goals, please see plan of care.  Estimated Length of Stay:  D/C today  Reason for Continuation of Hospitalization:   New Problems/Goals identified:  N/A  Discharge Plan or Barriers:   return home, follow up outpt  Additional Comments:  Attendees:  Signature: Ivin BootySarama Eappen, MD 06/26/2014 10:34 AM   Signature: Richelle Itood Lewanna Petrak, LCSW 06/26/2014 10:34 AM  Signature: Fransisca KaufmannLaura Davis, NP 06/26/2014 10:34 AM  Signature: Waynetta SandyJan Wright, RN 06/26/2014 10:34 AM  Signature:  06/26/2014 10:34 AM  Signature:  06/26/2014 10:34 AM  Signature:   06/26/2014 10:34 AM  Signature:    Signature:    Signature:    Signature:    Signature:    Signature:      Scribe for Treatment Team:   Richelle Itood Kimyah Frein, LCSW  06/26/2014 10:34 AM
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
## 2014-06-26 NOTE — [facility] Suicide Risk Assessment (Signed)
[facility]   Demographic Factors:  Male and Unemployed  Total Time spent with patient: 20 minutes  Musculoskeletal: Strength & Muscle Tone: within normal limits Gait & Station: normal Patient leans: N/A  Psychiatric Specialty Exam: Physical Exam  Review of Systems  Psychiatric/Behavioral: Positive for substance abuse (stable). Negative for depression, suicidal ideas and hallucinations. The patient is not nervous/anxious and does not have insomnia.     Blood pressure 137/80, pulse 114, temperature 97.8 F (36.6 C), temperature source Oral, resp. rate 20, height 5' 5.5" (1.664 m), weight 51.256 kg (113 lb).Body mass index is 18.51 kg/(m^2).  General Appearance: Casual  Eye Contact::  Fair  Speech:  Clear and Coherent409  Volume:  Normal  Mood:  Euthymic  Affect:  Appropriate  Thought Process:  Coherent  Orientation:  Full (Time, Place, and Person)  Thought Content:  WDL  Suicidal Thoughts:  No  Homicidal Thoughts:  No  Memory:  Immediate;   Fair Recent;   Fair Remote;   Fair  Judgement:  Fair  Insight:  Fair  Psychomotor Activity:  Normal  Concentration:  Fair  Recall:  [employer]Fair  Fund of Knowledge:Fair  Language: Fair  Akathisia:  No  Handed:  Right  AIMS (if indicated):     Assets:  Communication Skills Desire for Improvement  Sleep:  Number of Hours: 6.5  Cognition: WNL  ADL's:  Intact   Have you used any form of tobacco in the last 30 days? (Cigarettes, Smokeless Tobacco, Cigars, and/or Pipes): Yes  Has this patient used any form of tobacco in the last 30 days? (Cigarettes, Smokeless Tobacco, Cigars, and/or Pipes) No  Mental Status Per Nursing Assessment::   On Admission:  NA  Current Mental Status by Physician: pt denies SI/HI/AH/VH  Loss Factors: Financial problems/change in socioeconomic status  Historical Factors: Impulsivity  Risk Reduction Factors:   Living with another person, especially a relative and Positive social  support  Continued Clinical Symptoms:  Alcohol/Substance Abuse/Dependencies Previous Psychiatric Diagnoses and Treatments  Cognitive Features That Contribute To Risk:  None    Suicide Risk:  Minimal: No identifiable suicidal ideation.  Patients presenting with no risk factors but with morbid ruminations; may be classified as minimal risk based on the severity of the depressive symptoms  Principal Problem: Schizoaffective disorder, bipolar type Discharge Diagnoses:  Patient Active Problem List   Diagnosis Date Noted  . Cannabis use disorder, moderate, dependence [F12.20] 06/23/2014  . Schizoaffective disorder, bipolar type [F25.0] 06/22/2014      Plan Of Care/Follow-up recommendations:  Activity:  No restrictions Diet:  regular Tests:  as needed Other:  follow up with after care as needed  Is patient on multiple antipsychotic therapies at discharge:  No   Has Patient had three or more failed trials of antipsychotic monotherapy by history:  No  Recommended Plan for Multiple Antipsychotic Therapies: NA    Gustava Berland MD 06/26/2014, 9:31 AM

## 2014-06-28 NOTE — Progress Notes (Signed)
Patient Discharge Instructions:  After Visit Summary (AVS):   Faxed to:  06/28/14 Discharge Summary Note:   Faxed to:  06/28/14 Psychiatric Admission Assessment Note:   Faxed to:  06/28/14 Suicide Risk Assessment - Discharge Assessment:   Faxed to:  06/28/14 Faxed/Sent to the Next Level Care provider:  06/28/14 [facility] @ [phone] Faxed to [facility] @ [phone]  Jerelene ReddenSheena E Gray, 06/28/2014, 3:53 PM

## 2015-03-15 ENCOUNTER — Emergency Department (HOSPITAL_COMMUNITY)
Admission: EM | Admit: 2015-03-15 | Discharge: 2015-03-15 | Disposition: A | Discharge status: Home / Self Care | Payer: Self-pay | Attending: Emergency Medicine | Admitting: Emergency Medicine

## 2015-03-15 ENCOUNTER — Encounter (HOSPITAL_COMMUNITY): Payer: Self-pay | Admitting: Emergency Medicine

## 2015-03-15 DIAGNOSIS — F419 Anxiety disorder, unspecified: Secondary | ICD-10-CM | POA: Insufficient documentation

## 2015-03-15 DIAGNOSIS — F17200 Nicotine dependence, unspecified, uncomplicated: Secondary | ICD-10-CM | POA: Insufficient documentation

## 2015-03-15 DIAGNOSIS — F329 Major depressive disorder, single episode, unspecified: Secondary | ICD-10-CM | POA: Insufficient documentation

## 2015-03-15 DIAGNOSIS — R1084 Generalized abdominal pain: Secondary | ICD-10-CM | POA: Insufficient documentation

## 2015-03-15 DIAGNOSIS — B349 Viral infection, unspecified: Secondary | ICD-10-CM | POA: Insufficient documentation

## 2015-03-15 DIAGNOSIS — Z79899 Other long term (current) drug therapy: Secondary | ICD-10-CM | POA: Insufficient documentation

## 2015-03-15 LAB — CBC
HCT: 39.2 % (ref 39.0–52.0)
Hemoglobin: 13.1 g/dL (ref 13.0–17.0)
MCH: 31.3 pg (ref 26.0–34.0)
MCHC: 33.4 g/dL (ref 30.0–36.0)
MCV: 93.6 fL (ref 78.0–100.0)
PLATELETS: 203 10*3/uL (ref 150–400)
RBC: 4.19 MIL/uL — ABNORMAL LOW (ref 4.22–5.81)
RDW: 13.3 % (ref 11.5–15.5)
WBC: 5.6 10*3/uL (ref 4.0–10.5)

## 2015-03-15 LAB — LIPASE, BLOOD: Lipase: 23 U/L (ref 11–51)

## 2015-03-15 LAB — COMPREHENSIVE METABOLIC PANEL
ALT: 17 U/L (ref 17–63)
AST: 21 U/L (ref 15–41)
Albumin: 5 g/dL (ref 3.5–5.0)
Alkaline Phosphatase: 58 U/L (ref 38–126)
Anion gap: 6 (ref 5–15)
BILIRUBIN TOTAL: 0.5 mg/dL (ref 0.3–1.2)
BUN: 9 mg/dL (ref 6–20)
CHLORIDE: 104 mmol/L (ref 101–111)
CO2: 30 mmol/L (ref 22–32)
Calcium: 9.3 mg/dL (ref 8.9–10.3)
Creatinine, Ser: 0.95 mg/dL (ref 0.61–1.24)
GFR calc Af Amer: >60 mL/min (ref >60)
GFR calc non Af Amer: >60 mL/min (ref >60)
Glucose, Bld: 100 mg/dL — ABNORMAL HIGH (ref 65–99)
Potassium: 3.6 mmol/L (ref 3.5–5.1)
Sodium: 140 mmol/L (ref 135–145)
Total Protein: 7.7 g/dL (ref 6.5–8.1)

## 2015-03-15 MED ORDER — SODIUM CHLORIDE 0.9 % IV BOLUS (SEPSIS): 1000.0000 mL | Freq: Once | INTRAVENOUS | Status: DC

## 2015-03-15 MED ORDER — ONDANSETRON 4 MG PO TBDP
4.0000 mg | ORAL_TABLET | Freq: Once | ORAL | Status: AC
  Administered 2015-03-15: 4 mg via ORAL
  Filled 2015-03-15: qty 1

## 2015-03-15 MED ORDER — KETOROLAC TROMETHAMINE 30 MG/ML IJ SOLN
30.0000 mg | Freq: Once | INTRAMUSCULAR | Status: AC
  Administered 2015-03-15: 30 mg via INTRAMUSCULAR
  Filled 2015-03-15: qty 1

## 2015-03-15 MED ORDER — KETOROLAC TROMETHAMINE 30 MG/ML IJ SOLN: 30.0000 mg | Freq: Once | INTRAMUSCULAR | Status: DC

## 2015-03-15 MED ORDER — ONDANSETRON HCL 4 MG/2ML IJ SOLN: 4.0000 mg | Freq: Once | INTRAMUSCULAR | Status: DC

## 2015-03-15 MED ORDER — ONDANSETRON 4 MG PO TBDP: 4.0000 mg | ORAL_TABLET | Freq: Three times a day (TID) | ORAL | Status: DC | PRN

## 2015-03-15 NOTE — ED Notes (Signed)
Per pt, states abdominal pain and vomiting since Saturday-unable to keep POs dowm

## 2015-03-15 NOTE — ED Provider Notes (Signed)
CSN: [id number]     Arrival date & time 03/15/15  1801 History   First MD Initiated Contact with Patient 03/15/15 2057     Chief Complaint  Patient presents with  . Abdominal Pain     HPI   Paul Brown is an 24 y.o. male with history of anxiety and depression who presents to the ED for evaluation of abdominal pain. He states he originally had an URI that started around thanksgiving. He states he felt like his symptoms were improving but then for the past couple of days he has had diffuse intermittent abdominal cramping. He reports the pain has been associated with NBNB emesis and watery diarrhea. He is unable to quantify how many episodes of emesis/diarrhea he has had daily. He states he has had decreased appetite as well. States he has been around many family members, many of whom have been sick and have had similar symptoms. Denies fever, chills, dysuria, urinary frequency/urgency. Denies chest pain or SOB.   Past Medical History  Diagnosis Date  . Mood disorder (HCC) anxiety  . Anxiety depression  . Depression    History reviewed. No pertinent past surgical history. No family history on file. Social History  Substance Use Topics  . Smoking status: Current Every Day Smoker -- 0.50 packs/day  . Smokeless tobacco: Never Used  . Alcohol Use: No    Review of Systems  All other systems reviewed and are negative.     Allergies  Review of patient's allergies indicates no known allergies.  Home Medications   Prior to Admission medications   Medication Sig Start Date End Date Taking? Authorizing Provider  carbamazepine (TEGRETOL) 100 MG chewable tablet Chew 1 tablet (100 mg total) by mouth 2 (two) times daily. 06/26/14   Thermon Leyland, NP  chlorpheniramine (ALLER-CHLOR) 4 MG tablet Take 1 tablet (4 mg total) by mouth every 6 (six) hours as needed. 06/26/14   Thermon Leyland, NP  ibuprofen (ADVIL,MOTRIN) 600 MG tablet Take 1 tablet (600 mg total) by mouth every 6 (six) hours as needed.  06/26/14   Thermon Leyland, NP  nicotine (NICODERM CQ - DOSED IN MG/24 HOURS) 14 mg/24hr patch Place 1 patch (14 mg total) onto the skin daily. 06/26/14   Thermon Leyland, NP  OLANZapine zydis (ZYPREXA) 15 MG disintegrating tablet Take 1 tablet (15 mg total) by mouth at bedtime. 06/26/14   Thermon Leyland, NP  traZODone (DESYREL) 50 MG tablet Take 1 tablet (50 mg total) by mouth at bedtime as needed and may repeat dose one time if needed for sleep. 06/26/14   Thermon Leyland, NP   BP 147/98 mmHg  Pulse 66  Temp(Src) 97.8 F (36.6 C) (Oral)  Resp 16  SpO2 100% Physical Exam  Constitutional: He is oriented to person, place, and time. No distress.  HENT:  Right Ear: External ear normal.  Left Ear: External ear normal.  Nose: Nose normal.  Mouth/Throat: Oropharynx is clear and moist. No oropharyngeal exudate.  Eyes: Conjunctivae and EOM are normal. Pupils are equal, round, and reactive to light.  Neck: Normal range of motion. Neck supple.  Cardiovascular: Normal rate, regular rhythm, normal heart sounds and intact distal pulses.   Pulmonary/Chest: Effort normal and breath sounds normal. No respiratory distress. He has no wheezes.  Abdominal: Soft. Bowel sounds are normal. He exhibits no distension. There is no tenderness. There is no rebound and no guarding.  Musculoskeletal: He exhibits no edema.  Neurological: He is alert and  oriented to person, place, and time. No cranial nerve deficit.  Skin: Skin is warm and dry. He is not diaphoretic.  Psychiatric: He has a normal mood and affect.  Nursing note and vitals reviewed.   ED Course  Procedures (including critical care time) Labs Review Labs Reviewed  COMPREHENSIVE METABOLIC PANEL - Abnormal; Notable for the following:    Glucose, Bld 100 (*)    All other components within normal limits  CBC - Abnormal; Notable for the following:    RBC 4.19 (*)    All other components within normal limits  LIPASE, BLOOD    Imaging Review No results  found. I have personally reviewed and evaluated these images and lab results as part of my medical decision-making.   EKG Interpretation None      MDM   Final diagnoses:  Generalized abdominal pain  Viral syndrome   Labs unremarkable. Nonfocal exam. VitalsWNL and stable. Pt declines IV fluids. He does not want to provide urine for a urinalysis. PO zofran and toradol given. Pt able to tolerate PO. He is well appearing with unremarkable exam and labs. Will d/c home with ER return precautions and rx for supportive meds. Likely viral.   Paul CoriaSerena Y [name], PA-C 03/16/15 2140  Paul Nayobert Beaton, MD 03/19/15 [phone]

## 2015-03-15 NOTE — Discharge Instructions (Signed)
Your labs today were unremarkable. Your symptoms are likely due to a virus. I will give you a prescription for Zofran to take as needed for nausea.   Take medications as prescribed. Return to the emergency room for worsening condition or new concerning symptoms. Follow up with your regular doctor. If you don't have a regular doctor use one of the numbers below to establish a primary care doctor.   Emergency Department Resource Guide 1) Find a Doctor and Pay Out of Pocket Although you won't have to find out who is covered by your insurance plan, it is a good idea to ask around and get recommendations. You will then need to call the office and see if the doctor you have chosen will accept you as a new patient and what types of options they offer for patients who are self-pay. Some doctors offer discounts or will set up payment plans for their patients who do not have insurance, but you will need to ask so you aren't surprised when you get to your appointment.  2) Contact Your Local Health Department Not all health departments have doctors that can see patients for sick visits, but many do, so it is worth a call to see if yours does. If you don't know where your local health department is, you can check in your phone book. The CDC also has a tool to help you locate your state's health department, and many state websites also have listings of all of their local health departments.  3) Find a Walk-in Clinic If your illness is not likely to be very severe or complicated, you may want to try a walk in clinic. These are popping up all over the country in pharmacies, drugstores, and shopping centers. They're usually staffed by nurse practitioners or physician assistants that have been trained to treat common illnesses and complaints. They're usually fairly quick and inexpensive. However, if you have serious medical issues or chronic medical problems, these are probably not your best option.  No Primary Care  Doctor: - Call Health Connect at  [phone] - they can help you locate a primary care doctor that  accepts your insurance, provides certain services, etc. - Physician Referral [phone]  Emergency Department Resource Guide 1) Find a Doctor and Pay Out of Pocket Although you won't have to find out who is covered by your insurance plan, it is a good idea to ask around and get recommendations. You will then need to call the office and see if the doctor you have chosen will accept you as a new patient and what types of options they offer for patients who are self-pay. Some doctors offer discounts or will set up payment plans for their patients who do not have insurance, but you will need to ask so you aren't surprised when you get to your appointment.  2) Contact Your Local Health Department Not all health departments have doctors that can see patients for sick visits, but many do, so it is worth a call to see if yours does. If you don't know where your local health department is, you can check in your phone book. The CDC also has a tool to help you locate your state's health department, and many state websites also have listings of all of their local health departments.  3) Find a Walk-in Clinic If your illness is not likely to be very severe or complicated, you may want to try a walk in clinic. These are popping up all over the  country in pharmacies, drugstores, and shopping centers. They're usually staffed by nurse practitioners or physician assistants that have been trained to treat common illnesses and complaints. They're usually fairly quick and inexpensive. However, if you have serious medical issues or chronic medical problems, these are probably not your best option.  No Primary Care Doctor: - Call Health Connect at  [phone] - they can help you locate a primary care doctor that  accepts your insurance, provides certain services, etc. - Physician Referral Service-  [phone]  Chronic Pain Problems: Organization         Address  Phone   Notes  [facility]  [phone] Patients need to be referred by their primary care doctor.   Medication Assistance: Organization         Address  Phone   Notes  [facility] Medication [facility] [address] [place], [place] [address] [phone] --Must be a resident of [facility] -- Must have NO insurance coverage whatsoever (no Medicaid/ Medicare, etc.) -- The pt. MUST have a primary care doctor that directs their care regularly and follows them in the community   [facility]  [phone]   [employer]  [phone]    Agencies that provide inexpensive medical care: Organization         Address  Phone   Notes  [facility]  [phone]   [facility]    [phone]   [facility] [place], [place] [address] [phone]   [facility] [address] [place] [phone]   [facility]    [phone]   [facility]    [phone]   [place] [address], [place] Phone:  [phone], Fax:  [phone] Hours of Operation:  9 am - 6 pm, M-F.  Also accepts Medicaid/Medicare and self-pay.  [facility] [place] for [place], [address], [place] Phone: [phone], Fax: [phone]. Hours of Operation:  8:30 am - 5:30 pm, M-F.  Also accepts Medicaid and self-pay.  [facility] [place] [address], [place] Phone: [phone]   [place], [place], [place] [phone], Ext. [phone] Mondays & Thursdays: 7-9 AM.  First 15 patients are seen on a first come, first serve basis.    [place] Providers:  Organization         Address  Phone   Notes  [facility] [address],  [place] [phone] Also accepts self-pay patients.  [facility] [address], [place]  [phone]   [place], [address], [place] [phone]   [facility] [address] [place] [phone]   Lucianne Lei [address], [place]   [phone] Only accepts [place] patients after they have their name applied to their card.   Self-Pay (no insurance) in [facility]:  Organization         Address  Phone   Notes  Sickle Cell Patients, [facility] [place] [phone]   [facility] [place] [phone]   [facility] [place]  [place] [address], [place] [phone]) [phone]   [facility]/Dr. Osei-Bonsu  [address] [place] [address], [place] or [address], [place] [phone] Phone number for both [place] and [place] locations is the same.  [facility] [address], [place] [phone]   [facility] [place] [address] [place] [address], [place] or [address] [phone]) [phone]   [facility] [address], [place] [phone], phone; [phone], fax Sees patients 1st and 3rd Saturday of every month.  Must not qualify for public or private insurance (i.e. Medicaid, Medicare, [place] Health Choice, Veterans' Benefits)  Household income should be no more than 200% of the poverty level The clinic cannot treat you if you are pregnant or think you are pregnant  Sexually transmitted diseases are not treated at the clinic.

## 2015-09-22 ENCOUNTER — Encounter (HOSPITAL_COMMUNITY): Payer: Self-pay

## 2015-09-22 ENCOUNTER — Emergency Department (HOSPITAL_COMMUNITY)
Admission: EM | Admit: 2015-09-22 | Discharge: 2015-09-22 | Disposition: A | Discharge status: Home / Self Care | Payer: Self-pay | Attending: Emergency Medicine | Admitting: Emergency Medicine

## 2015-09-22 DIAGNOSIS — F17200 Nicotine dependence, unspecified, uncomplicated: Secondary | ICD-10-CM | POA: Insufficient documentation

## 2015-09-22 DIAGNOSIS — R1084 Generalized abdominal pain: Secondary | ICD-10-CM | POA: Insufficient documentation

## 2015-09-22 DIAGNOSIS — R109 Unspecified abdominal pain: Secondary | ICD-10-CM

## 2015-09-22 HISTORY — DX: Bipolar disorder, unspecified: F31.9

## 2015-09-22 LAB — CBC
HEMATOCRIT: 39.8 % (ref 39.0–52.0)
Hemoglobin: 13.3 g/dL (ref 13.0–17.0)
MCH: 30.7 pg (ref 26.0–34.0)
MCHC: 33.4 g/dL (ref 30.0–36.0)
MCV: 91.9 fL (ref 78.0–100.0)
PLATELETS: 205 10*3/uL (ref 150–400)
RBC: 4.33 MIL/uL (ref 4.22–5.81)
RDW: 13.5 % (ref 11.5–15.5)
WBC: 5.7 10*3/uL (ref 4.0–10.5)

## 2015-09-22 LAB — URINE MICROSCOPIC-ADD ON

## 2015-09-22 LAB — URINALYSIS, ROUTINE W REFLEX MICROSCOPIC
BILIRUBIN URINE: NEGATIVE
Glucose, UA: NEGATIVE mg/dL
Hgb urine dipstick: NEGATIVE
Ketones, ur: NEGATIVE mg/dL
Leukocytes, UA: NEGATIVE
Nitrite: NEGATIVE
PH: 6 (ref 5.0–8.0)
PROTEIN: 30 mg/dL — AB
Specific Gravity, Urine: 1.036 — ABNORMAL HIGH (ref 1.005–1.030)

## 2015-09-22 LAB — COMPREHENSIVE METABOLIC PANEL
ALT: 17 U/L (ref 17–63)
AST: 21 U/L (ref 15–41)
Albumin: 4.5 g/dL (ref 3.5–5.0)
Alkaline Phosphatase: 47 U/L (ref 38–126)
Anion gap: 8 (ref 5–15)
BUN: 12 mg/dL (ref 6–20)
CO2: 25 mmol/L (ref 22–32)
Calcium: 9.1 mg/dL (ref 8.9–10.3)
Chloride: 104 mmol/L (ref 101–111)
Creatinine, Ser: 1.02 mg/dL (ref 0.61–1.24)
GFR calc Af Amer: >60 mL/min (ref >60)
GFR calc non Af Amer: >60 mL/min (ref >60)
Glucose, Bld: 78 mg/dL (ref 65–99)
Potassium: 3.4 mmol/L — ABNORMAL LOW (ref 3.5–5.1)
Sodium: 137 mmol/L (ref 135–145)
Total Bilirubin: 0.6 mg/dL (ref 0.3–1.2)
Total Protein: 6.9 g/dL (ref 6.5–8.1)

## 2015-09-22 LAB — LIPASE, BLOOD: Lipase: 23 U/L (ref 11–51)

## 2015-09-22 MED ORDER — KETOROLAC TROMETHAMINE 30 MG/ML IJ SOLN
30.0000 mg | Freq: Once | INTRAMUSCULAR | Status: AC
  Administered 2015-09-22: 30 mg via INTRAVENOUS
  Filled 2015-09-22: qty 1

## 2015-09-22 MED ORDER — ONDANSETRON HCL 4 MG/2ML IJ SOLN
4.0000 mg | Freq: Once | INTRAMUSCULAR | Status: AC
  Administered 2015-09-22: 4 mg via INTRAVENOUS
  Filled 2015-09-22: qty 2

## 2015-09-22 MED ORDER — SODIUM CHLORIDE 0.9 % IV BOLUS (SEPSIS)
1000.0000 mL | Freq: Once | INTRAVENOUS | Status: AC
Start: 2015-09-22 — End: 2015-09-22
  Administered 2015-09-22: 1000 mL via INTRAVENOUS

## 2015-09-22 NOTE — Discharge Instructions (Signed)
Stay hydrated.   Avoid eating spicy food.   See your doctor.   Return to ER if you have severe abdominal pain, vomiting, fevers.

## 2015-09-22 NOTE — ED Provider Notes (Signed)
CSN: [id number]     Arrival date & time 09/22/15  1620 History   First MD Initiated Contact with Patient 09/22/15 1753     Chief Complaint  Patient presents with  . Generalized Body Aches  . Abdominal Pain     (Consider location/radiation/quality/duration/timing/severity/associated sxs/prior Treatment) The history is provided by the patient.  Paul Brown is a 25 y.o. male history of bipolar here presenting with abdominal pain, diarrhea. Intermittent diffuse abdominal pain for the last week or so. Had an episode of diarrhea that resolved. Denies eating uncooked food or any traveling. Any fevers or chills or urinary symptoms. Denies any sick contacts.    Past Medical History  Diagnosis Date  . Mood disorder (HCC) anxiety  . Anxiety depression  . Depression   . Bipolar 1 disorder (HCC)    History reviewed. No pertinent past surgical history. No family history on file. Social History  Substance Use Topics  . Smoking status: Current Every Day Smoker -- 0.50 packs/day  . Smokeless tobacco: Never Used  . Alcohol Use: No    Review of Systems  Gastrointestinal: Positive for abdominal pain.  All other systems reviewed and are negative.     Allergies  Review of patient's allergies indicates no known allergies.  Home Medications   Prior to Admission medications   Medication Sig Start Date End Date Taking? Authorizing Provider  carbamazepine (TEGRETOL) 200 MG tablet Take 100 mg by mouth daily. Take 0.5 tablets (200mg ) by mouth once daily   Yes Historical Provider, MD  cetirizine (ZYRTEC) 10 MG tablet Take 10 mg by mouth daily.   Yes Historical Provider, MD  traZODone (DESYREL) 50 MG tablet Take 1 tablet (50 mg total) by mouth at bedtime as needed and may repeat dose one time if needed for sleep. 06/26/14  Yes Thermon LeylandLaura A Davis, NP  carbamazepine (TEGRETOL) 100 MG chewable tablet Chew 1 tablet (100 mg total) by mouth 2 (two) times daily. Patient not taking: Reported on 03/15/2015  06/26/14   Thermon LeylandLaura A Davis, NP  chlorpheniramine (ALLER-CHLOR) 4 MG tablet Take 1 tablet (4 mg total) by mouth every 6 (six) hours as needed. Patient not taking: Reported on 03/15/2015 06/26/14   Thermon LeylandLaura A Davis, NP  nicotine (NICODERM CQ - DOSED IN MG/24 HOURS) 14 mg/24hr patch Place 1 patch (14 mg total) onto the skin daily. Patient not taking: Reported on 03/15/2015 06/26/14   Thermon LeylandLaura A Davis, NP  OLANZapine zydis (ZYPREXA) 15 MG disintegrating tablet Take 1 tablet (15 mg total) by mouth at bedtime. Patient not taking: Reported on 03/15/2015 06/26/14   Thermon LeylandLaura A Davis, NP  ondansetron (ZOFRAN ODT) 4 MG disintegrating tablet Take 1 tablet (4 mg total) by mouth every 8 (eight) hours as needed for nausea or vomiting. 03/15/15   Ace GinsSerena Y Sam, PA-C   BP 115/64 mmHg  Pulse 76  Temp(Src) 98.6 F (37 C)  Resp 18  SpO2 100% Physical Exam  Constitutional: He is oriented to person, place, and time. He appears well-developed and well-nourished.  HENT:  Head: Normocephalic.  Mouth/Throat: Oropharynx is clear and moist.  Eyes: Conjunctivae are normal. Pupils are equal, round, and reactive to light.  Neck: Normal range of motion. Neck supple.  Cardiovascular: Normal rate, regular rhythm and normal heart sounds.   Pulmonary/Chest: Effort normal and breath sounds normal. No respiratory distress. He has no wheezes. He has no rales.  Abdominal: Soft. Bowel sounds are normal. He exhibits no distension. There is no tenderness. There is no rebound  and no guarding.  Musculoskeletal: Normal range of motion. He exhibits no edema or tenderness.  Neurological: He is alert and oriented to person, place, and time. No cranial nerve deficit. Coordination normal.  Skin: Skin is warm and dry.  Psychiatric: He has a normal mood and affect. His behavior is normal. Judgment and thought content normal.  Nursing note and vitals reviewed.   ED Course  Procedures (including critical care time) Labs Review Labs Reviewed   COMPREHENSIVE METABOLIC PANEL - Abnormal; Notable for the following:    Potassium 3.4 (*)    All other components within normal limits  URINALYSIS, ROUTINE W REFLEX MICROSCOPIC (NOT AT [facility]) - Abnormal; Notable for the following:    Specific Gravity, Urine 1.036 (*)    Protein, ur 30 (*)    All other components within normal limits  URINE MICROSCOPIC-ADD ON - Abnormal; Notable for the following:    Squamous Epithelial / LPF 0-5 (*)    Bacteria, UA RARE (*)    All other components within normal limits  LIPASE, BLOOD  CBC    Imaging Review No results found. I have personally reviewed and evaluated these images and lab results as part of my medical decision-making.   EKG Interpretation None      MDM   Final diagnoses:  None    Paul Brown is a 25 y.o. male here with abdominal pain. No vomiting or fevers. Abdomen nontender. Likely gastro. Will get labs, hydrate and reassess.   6:56 PM Labs unremarkable. UA showed no ketones. Felt better with IVF. No vomiting in the ED. Repeat abdominal exam remained nontender. Will dc home. Likely gastro. Recommend BRAT diet.     Richardean Canal, MD 09/22/15 [phone]

## 2015-09-22 NOTE — ED Notes (Signed)
Patient here with body aches and pains since first of week. Complains of leg pain, abdominal pain. No vomiting, no diarrhea. Body pain worse with movement. No nausea

## 2015-09-22 NOTE — ED Notes (Signed)
Patient complains of intermittent abdominal pain that starts as a throbbing pain and becomes sharp that started Sunday evening.  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

## 2019-03-02 ENCOUNTER — Encounter (HOSPITAL_COMMUNITY): Payer: Self-pay

## 2019-03-02 ENCOUNTER — Inpatient Hospital Stay (HOSPITAL_COMMUNITY)
Admission: AD | Admit: 2019-03-02 | Discharge: 2019-03-09 | DRG: 885 | Disposition: A | Discharge status: Home / Self Care | Payer: No Typology Code available for payment source | Attending: Psychiatry | Admitting: Psychiatry

## 2019-03-02 ENCOUNTER — Other Ambulatory Visit: Payer: Self-pay

## 2019-03-02 DIAGNOSIS — G4700 Insomnia, unspecified: Secondary | ICD-10-CM | POA: Diagnosis present

## 2019-03-02 DIAGNOSIS — F419 Anxiety disorder, unspecified: Secondary | ICD-10-CM | POA: Diagnosis present

## 2019-03-02 DIAGNOSIS — F1220 Cannabis dependence, uncomplicated: Secondary | ICD-10-CM | POA: Diagnosis present

## 2019-03-02 DIAGNOSIS — F17210 Nicotine dependence, cigarettes, uncomplicated: Secondary | ICD-10-CM | POA: Diagnosis present

## 2019-03-02 DIAGNOSIS — Z560 Unemployment, unspecified: Secondary | ICD-10-CM

## 2019-03-02 DIAGNOSIS — Z20828 Contact with and (suspected) exposure to other viral communicable diseases: Secondary | ICD-10-CM | POA: Diagnosis present

## 2019-03-02 DIAGNOSIS — Z9114 Patient's other noncompliance with medication regimen: Secondary | ICD-10-CM | POA: Diagnosis not present

## 2019-03-02 DIAGNOSIS — F250 Schizoaffective disorder, bipolar type: Principal | ICD-10-CM | POA: Diagnosis present

## 2019-03-02 LAB — SARS CORONAVIRUS 2 BY RT PCR (HOSPITAL ORDER, PERFORMED IN ~~LOC~~ HOSPITAL LAB): SARS Coronavirus 2: NEGATIVE

## 2019-03-02 MED ORDER — HYDROXYZINE HCL 25 MG PO TABS
25.0000 mg | ORAL_TABLET | Freq: Three times a day (TID) | ORAL | Status: DC | PRN
  Administered 2019-03-03 – 2019-03-06 (×2): 25 mg via ORAL
  Filled 2019-03-02 (×3): qty 1

## 2019-03-02 MED ORDER — MAGNESIUM HYDROXIDE 400 MG/5ML PO SUSP: 30.0000 mL | Freq: Every day | ORAL | Status: DC | PRN

## 2019-03-02 MED ORDER — TRAZODONE HCL 50 MG PO TABS
50.0000 mg | ORAL_TABLET | Freq: Every evening | ORAL | Status: DC | PRN
  Administered 2019-03-03 (×2): 50 mg via ORAL
  Filled 2019-03-02 (×2): qty 1

## 2019-03-02 MED ORDER — ALUM & MAG HYDROXIDE-SIMETH 200-200-20 MG/5ML PO SUSP: 30.0000 mL | ORAL | Status: DC | PRN

## 2019-03-02 MED ORDER — ACETAMINOPHEN 325 MG PO TABS
650.0000 mg | ORAL_TABLET | Freq: Four times a day (QID) | ORAL | Status: DC | PRN
  Administered 2019-03-06: 21:00:00 650 mg via ORAL
  Filled 2019-03-02: qty 2

## 2019-03-02 NOTE — H&P (Signed)
BH Observation Unit Provider Admission PAA/H&P  Patient Identification: Paul Brown MRN:  [id number] Date of Evaluation:  03/02/2019 Chief Complaint:  Aggressive behavior Principal Diagnosis: Schizoaffective disorder, bipolar type (HCC) Diagnosis:  Principal Problem:   Schizoaffective disorder, bipolar type (HCC)  History of Present Illness:  Paul Brown is an 28 y.o. male who presents as a walk-in brought in by [employer] due to mother taking out IVC papers. Per IVC patient is a danger to himself and mother; smokes THC daily and has a sword; mother is unsafe living with patient and he is non-compliant with his meds.  Patient denies everything on the IVC papers. Pt sates that the mother called the cops on him because they do not get along and are always having conflicts. Pt states that mother took his car keys 2 days ago and will not give it back. States that mother "wants to steal his identity" and he wants to get away from family because "they don't like me and are obsessed with my dreams". States that family is not supportive. Pt reports that he lost his maternal grandfather a month ago and he has been stressed since then seeing how much family conflict that is going on as a result. Pt states he is currently unemployed and dropped out of his college online classes because it was getting stressful. Pt denies drug use and states that he uses CBD oil daily. He denies SI/HI, AVH, psychosis and paranoia. He denies access to guns but admits he has a sword. He states he wants to be admitted for at least a day because he does not feel safe at home.   During evaluation pt is sitting; he is alert/oriented x 4; calm/cooperative; and mood is euthymic congruent with affect. Patient is speaking in a clear tone at moderate volume, and normal pace; with good eye contact. His thought process is coherent and relevant; There is no indication that he is currently responding to internal/external stimuli or  experiencing delusional thought content. Judgement and impulse is intact, insight is fair. Patient has remained calm throughout assessment and has answered questions appropriately.    Associated Signs/Symptoms: Depression Symptoms:  NA (Hypo) Manic Symptoms:  NA Anxiety Symptoms:  NA Psychotic Symptoms:  NA PTSD Symptoms: NA Total Time spent with patient: 30 minutes  Past Psychiatric History: Yes  Is the patient at risk to self? No.  Has the patient been a risk to self in the past 6 months? No.  Has the patient been a risk to self within the distant past? No.  Is the patient a risk to others? No.  Has the patient been a risk to others in the past 6 months? No.  Has the patient been a risk to others within the distant past? No.   Prior Inpatient Therapy: Prior Inpatient Therapy: Yes Prior Therapy Dates: 2017 Prior Therapy Facilty/Provider([facility] Reason for Treatment: inapti9ent Prior Outpatient Therapy: Prior Outpatient Therapy: Yes Prior Therapy Dates: 2016 to current Prior Therapy Facilty/Provider(s): [facility] Reason for Treatment: med management & therapy Does patient have an ACCT team?: No Does patient have Intensive In-House Services?  : No Does patient have [facility] services? : Yes Does patient have P4CC services?: No  Alcohol Screening:   Substance Abuse History in the last 12 months:  Yes.   Consequences of Substance Abuse: Unknown Previous Psychotropic Medications: Yes  Psychological Evaluations: Yes  Past Medical History:  Past Medical History:  Diagnosis Date  . Anxiety depression  . Bipolar 1 disorder (HCC)   .  Depression   . Mood disorder (HCC) anxiety   History reviewed. No pertinent surgical history. Family History: History reviewed. No pertinent family history. Family Psychiatric History: Unknown Tobacco Screening:   Social History:  Social History   Substance and Sexual Activity  Alcohol Use No     Social History   Substance and Sexual Activity   Drug Use No    Additional Social History: Marital status: Single    Prescriptions: Tegretol & Zyprexa .  reports taking Tegretol morning and night.  Zypreza for sleep. Over the Counter: None History of alcohol / drug use?: Yes Name of Substance 1: CBD 1 - Age of First Use: unknown 1 - Amount (size/oz): Varies 1 - Frequency: "Whenever I have muscle aches or anxiety 1 - Duration: off and on 1 - Last Use / Amount: Today                  Allergies:  No Known Allergies Lab Results: No results found for this or any previous visit (from the past 48 hour(s)).  Blood Alcohol level:  Lab Results  Component Value Date   [facility] 06/21[facility]2014   ETH <11 02/07/2013    Metabolic Disorder Labs:  Lab Results  Component Value Date   HGBA1C 5.9 (H) 06/24/2014   MPG 123 06/24/2014   No results found for: PROLACTIN Lab Results  Component Value Date   CHOL 169 06/24/2014   TRIG 78 06/24/2014   HDL 62 06/24/2014   CHOLHDL 2.7 06/24/2014   VLDL 16 06/24/2014   LDLCALC 91 06/24/2014    Current Medications: Current Facility-Administered Medications  Medication Dose Route Frequency Provider Last Rate Last Dose  . acetaminophen (TYLENOL) tablet 650 mg  650 mg Oral Q6H PRN Shimika Ames C, NP      . alum & mag hydroxide-simeth (MAALOX/MYLANTA) 200-200-20 MG/5ML suspension 30 mL  30 mL Oral Q4H PRN Neiva Maenza C, NP      . hydrOXYzine (ATARAX/VISTARIL) tablet 25 mg  25 mg Oral TID PRN Tirso Laws C, NP      . magnesium hydroxide (MILK OF MAGNESIA) suspension 30 mL  30 mL Oral Daily PRN Bellamy Rubey C, NP      . traZODone (DESYREL) tablet 50 mg  50 mg Oral QHS PRN Jakelin Taussig C, NP       PTA Medications: Medications Prior to Admission  Medication Sig Dispense Refill Last Dose  . carbamazepine (TEGRETOL) 100 MG chewable tablet Chew 1 tablet (100 mg total) by mouth 2 (two) times daily. (Patient not taking: Reported on 03/15/2015) 60 tablet 0   . carbamazepine (TEGRETOL) 200 MG tablet  Take 100 mg by mouth daily. Take 0.5 tablets (200mg ) by mouth once daily     . cetirizine (ZYRTEC) 10 MG tablet Take 10 mg by mouth daily.     . chlorpheniramine (ALLER-CHLOR) 4 MG tablet Take 1 tablet (4 mg total) by mouth every 6 (six) hours as needed. (Patient not taking: Reported on 03/15/2015) 14 tablet 0   . nicotine (NICODERM CQ - DOSED IN MG/24 HOURS) 14 mg/24hr patch Place 1 patch (14 mg total) onto the skin daily. (Patient not taking: Reported on 03/15/2015) 28 patch 0   . OLANZapine zydis (ZYPREXA) 15 MG disintegrating tablet Take 1 tablet (15 mg total) by mouth at bedtime. (Patient not taking: Reported on 03/15/2015) 30 tablet 0   . ondansetron (ZOFRAN ODT) 4 MG disintegrating tablet Take 1 tablet (4 mg total) by mouth every 8 (eight) hours  as needed for nausea or vomiting. 20 tablet 0   . traZODone (DESYREL) 50 MG tablet Take 1 tablet (50 mg total) by mouth at bedtime as needed and may repeat dose one time if needed for sleep. 60 tablet 0     Musculoskeletal: Strength & Muscle Tone: within normal limits Gait & Station: normal Patient leans: N/A  Psychiatric Specialty Exam: Physical Exam  ROS  Blood pressure (!) 153/79, pulse (!) 109, temperature 98.3 F (36.8 C), temperature source Oral, resp. rate 16, SpO2 100 %.There is no height or weight on file to calculate BMI.  General Appearance: Casual  Eye Contact:  Good  Speech:  Normal Rate  Volume:  Normal  Mood:  Euthymic  Affect:  Congruent  Thought Process:  Coherent and Descriptions of Associations: Intact  Orientation:  Full (Time, Place, and Person)  Thought Content:  WDL  Suicidal Thoughts:  No  Homicidal Thoughts:  No  Memory:  Recent;   Good  Judgement:  Good  Insight:  Fair  Psychomotor Activity:  Normal  Concentration:  Concentration: Good  Recall:  Good  Fund of Knowledge:  Good  Language:  Good  Akathisia:  No  Handed:  Right  AIMS (if indicated):     Assets:  Communication Skills Desire for Improvement   ADL's:  Intact  Cognition:  WNL  Sleep:       Disposition: Recommend psychiatric Inpatient admission when medically cleared. Supportive therapy provided about ongoing stressors.  Will review IVC in AM.  Treatment Plan Summary: Daily contact with patient to assess and evaluate symptoms and progress in treatment and Medication management  Observation Level/Precautions:  15 minute checks Laboratory:  Chemistry Profile HbAIC UDS Psychotherapy:   Medications:   Consultations:   Discharge Concerns:   Estimated LOS: Other:      Mliss Fritz, NP 11/25/202011:26 PM
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
## 2019-03-02 NOTE — BH Assessment (Signed)
[facility] Assessment Progress Note   Mother's name is Minda Meo [phone].  She wants to know when patient may be discharged.  She said she cannot have him return to residence because the landlord does not want him back on the property.  She is also worried about her own safety.

## 2019-03-02 NOTE — Progress Notes (Deleted)
Patient ID: Paul Brown, male   DOB: Nov 16, 1990, 28 y.o.   MRN: [id number] Pt will be admitted to Adult Unit. Skin assessment and search completed. No contraband noted. Old Scar noted to Left Shoulder and back of both legs. Pt alert, calm and cooperative with assessment. No apparent distress noted or complaints voiced. Will continue to monitor q 15 mins for safety.
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
## 2019-03-03 DIAGNOSIS — F250 Schizoaffective disorder, bipolar type: Principal | ICD-10-CM

## 2019-03-03 LAB — COMPREHENSIVE METABOLIC PANEL
ALT: 44 U/L (ref 0–44)
AST: 84 U/L — ABNORMAL HIGH (ref 15–41)
Albumin: 4.5 g/dL (ref 3.5–5.0)
Alkaline Phosphatase: 64 U/L (ref 38–126)
Anion gap: 8 (ref 5–15)
BUN: 18 mg/dL (ref 6–20)
CO2: 28 mmol/L (ref 22–32)
Calcium: 9.1 mg/dL (ref 8.9–10.3)
Chloride: 104 mmol/L (ref 98–111)
Creatinine, Ser: 1.06 mg/dL (ref 0.61–1.24)
GFR calc Af Amer: >60 mL/min (ref >60)
GFR calc non Af Amer: >60 mL/min (ref >60)
Glucose, Bld: 83 mg/dL (ref 70–99)
Potassium: 3.7 mmol/L (ref 3.5–5.1)
Sodium: 140 mmol/L (ref 135–145)
Total Bilirubin: 0.4 mg/dL (ref 0.3–1.2)
Total Protein: 7.6 g/dL (ref 6.5–8.1)

## 2019-03-03 LAB — CARBAMAZEPINE LEVEL, TOTAL: Carbamazepine Lvl: <2 ug/mL — ABNORMAL LOW (ref 4.0–12.0)

## 2019-03-03 LAB — ETHANOL: Alcohol, Ethyl (B): <10 mg/dL (ref <10)

## 2019-03-03 LAB — LIPID PANEL
Cholesterol: 136 mg/dL (ref 0–200)
HDL: 53 mg/dL (ref >40)
LDL Cholesterol: 65 mg/dL (ref 0–99)
Total CHOL/HDL Ratio: 2.6 RATIO
Triglycerides: 91 mg/dL (ref <150)
VLDL: 18 mg/dL (ref 0–40)

## 2019-03-03 LAB — HEMOGLOBIN A1C
Hgb A1c MFr Bld: 5.8 % — ABNORMAL HIGH (ref 4.8–5.6)
Mean Plasma Glucose: 119.76 mg/dL

## 2019-03-03 LAB — TSH: TSH: 1.416 u[IU]/mL (ref 0.350–4.500)

## 2019-03-03 MED ORDER — CARBAMAZEPINE 200 MG PO TABS
100.0000 mg | ORAL_TABLET | Freq: Two times a day (BID) | ORAL | Status: DC
  Filled 2019-03-03 (×3): qty 0.5

## 2019-03-03 MED ORDER — CARBAMAZEPINE 200 MG PO TABS: 200.0000 mg | ORAL_TABLET | Freq: Two times a day (BID) | ORAL | Status: DC

## 2019-03-03 MED ORDER — ENSURE ENLIVE PO LIQD
237.0000 mL | Freq: Two times a day (BID) | ORAL | Status: DC
  Administered 2019-03-03 – 2019-03-07 (×6): 237 mL via ORAL

## 2019-03-03 MED ORDER — CARBAMAZEPINE 200 MG PO TABS
200.0000 mg | ORAL_TABLET | Freq: Two times a day (BID) | ORAL | Status: DC
  Filled 2019-03-03: qty 1

## 2019-03-03 MED ORDER — OLANZAPINE 5 MG PO TABS
5.0000 mg | ORAL_TABLET | Freq: Every day | ORAL | Status: DC
  Administered 2019-03-03 – 2019-03-07 (×5): 5 mg via ORAL
  Filled 2019-03-03 (×5): qty 1
  Filled 2019-03-03: qty 2
  Filled 2019-03-03 (×2): qty 1
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
## 2019-03-03 NOTE — Progress Notes (Signed)
Patient has been isolative to self, noted to be engaged in conversations with unseen others, but when asked if he was hearing voices or seeing things he denied.  Patient complained of note being able to eat solid foods and asked for some yogurt.  Patient has had no incidents of behavioral dyscontrol this shift.   Assess patient for safety, offer medications as prescribed, engage patient in 1:1 staff talks.   Patient able to contract for safety.  Continue to monitor as planned.
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
## 2019-03-03 NOTE — Progress Notes (Signed)
Patient ID: Paul Brown, male   DOB: 17-Feb-1991, 28 y.o.   MRN: [id number] D: Pt calm/cooperative with care, denies SI/HI/AVH, has been interacting with staff appropriately, no signs/symptoms of distress noted, none reported by pt.    A: Pt being monitored via Q15 minute checks and took all of his meds as ordered, Trazodone 50mg  given for insomnia.  R: Will continue to monitor on Q15 minute checks.

## 2019-03-03 NOTE — Progress Notes (Signed)
Paul Brown is a 28 y.o. male Voluntary admitted as a walk in. Pt state his mother called the police on him although he was calm. Pt he was to go jail but instead he was brought to the hospital. Pt stated he does not understand why his mom called copes on him. Pt is calm and cooperative with admission process. Skin searched completed and pt noted to be having tattoos all over his body. Consents signed and pt oriented to unit. Pt stable at this time. Pt given the opportunity to express concerns and ask questions. Pt given toiletries. Will continue to monitor.

## 2019-03-03 NOTE — Progress Notes (Signed)
Pt woke up stating he wanted something to help him sleep. Pt also asked for a boost, but pt was asked nutrition questions and pt scored to get an ensure, so pt was informed is would be available later. Pt was given Trazodone  And Vistaril per [facility]

## 2019-03-03 NOTE — Tx Team (Addendum)
Initial Treatment Plan 03/03/2019 12:59 AM Paul Brown JAS:[id number]    PATIENT STRESSORS: Financial difficulties Legal issue Marital or family conflict Substance abuse   PATIENT STRENGTHS: Active sense of humor Supportive family/friends   PATIENT IDENTIFIED PROBLEMS: Depression  Anxiety  "get back on medications"                 DISCHARGE CRITERIA:  Ability to meet basic life and health needs Improved stabilization in mood, thinking, and/or behavior Medical problems require only outpatient monitoring Motivation to continue treatment in a less acute level of care  PRELIMINARY DISCHARGE PLAN: Attend aftercare/continuing care group Attend PHP/IOP Outpatient therapy Participate in family therapy Return to previous living arrangement  PATIENT/FAMILY INVOLVEMENT: This treatment plan has been presented to and reviewed with the patient, Paul Brown, and/or family member.  The patient and family have been given the opportunity to ask questions and make suggestions.  [name], RN 03/03/2019, 12:59 AM

## 2019-03-04 LAB — PROLACTIN: Prolactin: 6 ng/mL (ref 4.0–15.2)

## 2019-03-04 MED ORDER — TRAZODONE HCL 100 MG PO TABS
100.0000 mg | ORAL_TABLET | Freq: Every day | ORAL | Status: DC
  Administered 2019-03-04: 100 mg via ORAL
  Filled 2019-03-04 (×2): qty 1

## 2019-03-04 MED ORDER — CARBAMAZEPINE 100 MG PO CHEW
100.0000 mg | CHEWABLE_TABLET | Freq: Two times a day (BID) | ORAL | Status: DC
  Administered 2019-03-04 – 2019-03-09 (×11): 100 mg via ORAL
  Filled 2019-03-04 (×13): qty 1
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
## 2019-03-04 NOTE — Progress Notes (Signed)
[place] NOVEL CORONAVIRUS (COVID-19) DAILY CHECK-OFF SYMPTOMS - answer yes or no to each - every day NO YES  Have you had a fever in the past 24 hours?  . Fever (Temp > 37.80C / 100F) X   Have you had any of these symptoms in the past 24 hours? . New Cough .  Sore Throat  .  Shortness of Breath .  Difficulty Breathing .  Unexplained Body Aches   X   Have you had any one of these symptoms in the past 24 hours not related to allergies?   . Runny Nose .  Nasal Congestion .  Sneezing   X   If you have had runny nose, nasal congestion, sneezing in the past 24 hours, has it worsened?  X   EXPOSURES - check yes or no X   Have you traveled outside the state in the past 14 days?  X   Have you been in contact with someone with a confirmed diagnosis of COVID-19 or PUI in the past 14 days without wearing appropriate PPE?  X   Have you been living in the same home as a person with confirmed diagnosis of COVID-19 or a PUI (household contact)?    X   Have you been diagnosed with COVID-19?    X              What to do next: Answered NO to all: Answered YES to anything:   Proceed with unit schedule Follow the [facility] Inpatient Flowsheet.   

## 2019-03-04 NOTE — Progress Notes (Signed)
Recreation Therapy Notes  INPATIENT RECREATION THERAPY ASSESSMENT  Patient Details Name: Paul Brown MRN: [id number] DOB: 1990-11-13 Today's Date: 03/04/2019       Information Obtained From: Patient  Able to Participate in Assessment/Interview: Yes  Patient Presentation: Alert  Reason for Admission (Per Patient): Other (Comments)(Pt stated family drama)  Patient Stressors: Family  Coping Skills:   Isolation, Sports, Deep Breathing, Meditate, Arguments, Avoidance, Exercise, Music, Substance Abuse, Prayer, Read, Hot Bath/Shower  Leisure Interests (2+):  Sports - Football, Sports - Basketball, Sports - Other (Comment), Individual - Writing, Exercise - Walking, Exercise - Running, Individual - Other (Comment), Games - Video games(Tennis, Track, Software engineer, Electronics engineer, Work, Training and development officer, Try new things, Dance)  Frequency of Recreation/Participation: Other (Comment)(Pt stated he tries to do something everyday)  Awareness of Community Resources:  Yes  Community Resources:  [employer], [employer]  Current Use: No  If no, Barriers?: Other (Comment)(COVID)  Expressed Interest in [employer] Information: No  [employer] of Residence:  [place]  Patient Main Form of Transportation: (Pt stated he had a car until his mother got rid of it)  Patient Strengths:  Knowledge, [place], Like to understand other's point of view  Patient Identified Areas of Improvement:  Health; Getting more sleep  Patient Goal for Hospitalization:  "figure out what I need to do"  Current SI (including self-harm):  No  Current HI:  No  Current AVH: No  Staff Intervention Plan: Group Attendance, Collaborate with Interdisciplinary Treatment Team  Consent to Intern Participation: N/A    Victorino Sparrow, LRT/CTRS  Ria Comment, Janesa Dockery A 03/04/2019, 1:01 PM

## 2019-03-04 NOTE — Tx Team (Signed)
Interdisciplinary Treatment and Diagnostic Plan Update  03/04/2019 Time of Session: 10:05am Corinna LinesDwaine E Millman MRN: [id number]  Principal Diagnosis: Schizoaffective disorder, bipolar type [facility](HCC)  Secondary Diagnoses: Principal Problem:   Schizoaffective disorder, bipolar type (HCC)   Current Medications:  Current Facility-Administered Medications  Medication Dose Route Frequency Provider Last Rate Last Dose  . acetaminophen (TYLENOL) tablet 650 mg  650 mg Oral Q6H PRN Anike, Adaku C, NP      . alum & mag hydroxide-simeth (MAALOX/MYLANTA) 200-200-20 MG/5ML suspension 30 mL  30 mL Oral Q4H PRN Anike, Adaku C, NP      . carbamazepine (TEGRETOL) chewable tablet 100 mg  100 mg Oral BID Malvin JohnsFarah, Brian, MD   100 mg at 03/04/19 0851  . feeding supplement (ENSURE ENLIVE) (ENSURE ENLIVE) liquid 237 mL  237 mL Oral BID BM Anike, Adaku C, NP   237 mL at 03/04/19 0923  . hydrOXYzine (ATARAX/VISTARIL) tablet 25 mg  25 mg Oral TID PRN Anike, Adaku C, NP   25 mg at 03/03/19 0111  . magnesium hydroxide (MILK OF MAGNESIA) suspension 30 mL  30 mL Oral Daily PRN Anike, Adaku C, NP      . OLANZapine (ZYPREXA) tablet 5 mg  5 mg Oral QHS Oneta RackLewis, Tanika N, NP   5 mg at 03/03/19 2129  . traZODone (DESYREL) tablet 50 mg  50 mg Oral QHS PRN Anike, Adaku C, NP   50 mg at 03/03/19 2129   PTA Medications: Medications Prior to Admission  Medication Sig Dispense Refill Last Dose  . carbamazepine (TEGRETOL) 100 MG chewable tablet Chew 1 tablet (100 mg total) by mouth 2 (two) times daily. (Patient not taking: Reported on 03/15/2015) 60 tablet 0   . OLANZapine zydis (ZYPREXA) 15 MG disintegrating tablet Take 1 tablet (15 mg total) by mouth at bedtime. (Patient not taking: Reported on 03/15/2015) 30 tablet 0   . traZODone (DESYREL) 50 MG tablet Take 1 tablet (50 mg total) by mouth at bedtime as needed and may repeat dose one time if needed for sleep. (Patient not taking: Reported on 03/03/2019) 60 tablet 0 Not Taking at Unknown  time    Patient Stressors: Financial difficulties Legal issue Marital or family conflict Substance abuse  Patient Strengths: Active sense of humor Supportive family/friends  Treatment Modalities: Medication Management, Group therapy, Case management,  1 to 1 session with clinician, Psychoeducation, Recreational therapy.   Physician Treatment Plan for Primary Diagnosis: Schizoaffective disorder, bipolar type (HCC) Long Term Goal(s): Improvement in symptoms so as ready for discharge Improvement in symptoms so as ready for discharge   Short Term Goals: Ability to identify changes in lifestyle to reduce recurrence of condition will improve Ability to verbalize feelings will improve Ability to disclose and discuss suicidal ideas Ability to demonstrate self-control will improve Ability to identify and develop effective coping behaviors will improve Ability to maintain clinical measurements within normal limits will improve Compliance with prescribed medications will improve Ability to identify changes in lifestyle to reduce recurrence of condition will improve Ability to verbalize feelings will improve Ability to disclose and discuss suicidal ideas Ability to demonstrate self-control will improve Ability to identify and develop effective coping behaviors will improve Ability to maintain clinical measurements within normal limits will improve  Medication Management: Evaluate patient's response, side effects, and tolerance of medication regimen.  Therapeutic Interventions: 1 to 1 sessions, Unit Group sessions and Medication administration.  Evaluation of Outcomes: Progressing  Physician Treatment Plan for Secondary Diagnosis: Principal Problem:   Schizoaffective disorder,  bipolar type (HCC)  Long Term Goal(s): Improvement in symptoms so as ready for discharge Improvement in symptoms so as ready for discharge   Short Term Goals: Ability to identify changes in lifestyle to reduce  recurrence of condition will improve Ability to verbalize feelings will improve Ability to disclose and discuss suicidal ideas Ability to demonstrate self-control will improve Ability to identify and develop effective coping behaviors will improve Ability to maintain clinical measurements within normal limits will improve Compliance with prescribed medications will improve Ability to identify changes in lifestyle to reduce recurrence of condition will improve Ability to verbalize feelings will improve Ability to disclose and discuss suicidal ideas Ability to demonstrate self-control will improve Ability to identify and develop effective coping behaviors will improve Ability to maintain clinical measurements within normal limits will improve     Medication Management: Evaluate patient's response, side effects, and tolerance of medication regimen.  Therapeutic Interventions: 1 to 1 sessions, Unit Group sessions and Medication administration.  Evaluation of Outcomes: Progressing   RN Treatment Plan for Primary Diagnosis: Schizoaffective disorder, bipolar type (HCC) Long Term Goal(s): Knowledge of disease and therapeutic regimen to maintain health will improve  Short Term Goals: Ability to participate in decision making will improve, Ability to verbalize feelings will improve, Ability to disclose and discuss suicidal ideas, Ability to identify and develop effective coping behaviors will improve and Compliance with prescribed medications will improve  Medication Management: RN will administer medications as ordered by provider, will assess and evaluate patient's response and provide education to patient for prescribed medication. RN will report any adverse and/or side effects to prescribing provider.  Therapeutic Interventions: 1 on 1 counseling sessions, Psychoeducation, Medication administration, Evaluate responses to treatment, Monitor vital signs and CBGs as ordered, Perform/monitor CIWA,  COWS, AIMS and Fall Risk screenings as ordered, Perform wound care treatments as ordered.  Evaluation of Outcomes: Progressing   LCSW Treatment Plan for Primary Diagnosis: Schizoaffective disorder, bipolar type (HCC) Long Term Goal(s): Safe transition to appropriate next level of care at discharge, Engage patient in therapeutic group addressing interpersonal concerns.  Short Term Goals: Engage patient in aftercare planning with referrals and resources and Increase skills for wellness and recovery  Therapeutic Interventions: Assess for all discharge needs, 1 to 1 time with Social worker, Explore available resources and support systems, Assess for adequacy in community support network, Educate family and significant other(s) on suicide prevention, Complete Psychosocial Assessment, Interpersonal group therapy.  Evaluation of Outcomes: Progressing   Progress in Treatment: Attending groups: No. Participating in groups: No. Taking medication as prescribed: Yes. Toleration medication: Yes. Family/Significant other contact made: No, will contact:  will contact if given consent to contact Patient understands diagnosis: No. Discussing patient identified problems/goals with staff: Yes. Medical problems stabilized or resolved: Yes. Denies suicidal/homicidal ideation: Yes. Issues/concerns per patient self-inventory: No. Other:   New problem(s) identified: No, Describe:  None  New Short Term/Long Term Goal(s): Medication stabilization, elimination of SI thoughts, and development of a comprehensive mental wellness plan.   Patient Goals:    Discharge Plan or Barriers: CSW will continue to follow up for appropriate referrals and possible discharge planning  Reason for Continuation of Hospitalization: Aggression Depression Hallucinations Medication stabilization  Estimated Length of Stay: 2-3 days   Attendees: Patient: 03/04/2019   Physician: Dr. Nehemiah Massed, MD 03/04/2019   Nursing:  Lincoln Maxin, RN 03/04/2019   RN Care Manager: 03/04/2019  Social Worker: Stephannie Peters, LCSW  03/04/2019   Recreational Therapist:  03/04/2019  Other:  03/04/2019  Other:  03/04/2019   Other: 03/04/2019     Scribe for Treatment Team: Trecia Rogers, LCSW 03/04/2019 1:02 PM
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
## 2019-03-04 NOTE — Progress Notes (Signed)
   03/04/19 2150  Psych Admission Type (Psych Patients Only)  Admission Status Involuntary  Psychosocial Assessment  Patient Complaints None  Eye Contact Fair  Facial Expression Flat;Sad  Affect Appropriate to circumstance  Speech Logical/coherent;Soft;Slow  Interaction Cautious;Guarded;Minimal  Motor Activity Slow  Appearance/Hygiene Layered clothes;Meticulous  Behavior Characteristics Cooperative;Appropriate to situation  Mood Depressed;Pleasant  Aggressive Behavior  Targets Self;Property;Family  Type of Behavior Verbal;Threatening  Effect No apparent injury  Thought Process  Coherency WDL  Content WDL  Delusions None reported or observed (None observed this shift. )  Perception WDL  Hallucination None reported or observed  Judgment Limited  Confusion None  Danger to Self  Current suicidal ideation? Denies  Danger to Others  Danger to Others None reported or observed

## 2019-03-04 NOTE — Progress Notes (Signed)
D. Pt isolative to room- calm and cooperative but guarded Pt currently denies SI/HI and AVH   A. Labs and vitals monitored. Pt compliant with medications. Pt supported emotionally and encouraged to express concerns and ask questions.   R. Pt remains safe with 15 minute checks. Will continue POC.

## 2019-03-05 LAB — URINALYSIS, COMPLETE (UACMP) WITH MICROSCOPIC
Bacteria, UA: NONE SEEN
Bilirubin Urine: NEGATIVE
Glucose, UA: NEGATIVE mg/dL
Hgb urine dipstick: NEGATIVE
Ketones, ur: 20 mg/dL — AB
Leukocytes,Ua: NEGATIVE
Nitrite: NEGATIVE
Protein, ur: 100 mg/dL — AB
Specific Gravity, Urine: 1.027 (ref 1.005–1.030)
pH: 5 (ref 5.0–8.0)

## 2019-03-05 LAB — RAPID URINE DRUG SCREEN, HOSP PERFORMED
Amphetamines: NOT DETECTED
Barbiturates: NOT DETECTED
Benzodiazepines: NOT DETECTED
Cocaine: NOT DETECTED
Opiates: NOT DETECTED
Tetrahydrocannabinol: POSITIVE — AB

## 2019-03-05 MED ORDER — TRAZODONE HCL 100 MG PO TABS
100.0000 mg | ORAL_TABLET | Freq: Every evening | ORAL | Status: DC | PRN
  Administered 2019-03-05 (×2): 100 mg via ORAL
  Filled 2019-03-05 (×5): qty 1

## 2019-03-05 NOTE — [facility] Counselor (Signed)
Clinical Social Work Note  Patient's mother called and said that she is very concerned that patient will be released prior to being ready, and said she wants to be able to take him to [place] on Monday where she has been told she can have him admitted for a "longer-term program."  CSW provided education about the Crisis Unit at [facility] and what they can actually do, and that they actually feed into this inpatient unit so it would not be appropriate for him to discharge from here and go there.  Given this, mother said he will go live with his father in [place] instead.  They need 24 hours notice in order to make that happen.  She said she will take care of making appointments for his aftercare there, but CSW reminded her that weekday CSW can get involved in arranging aftercare.  Selmer Dominion, LCSW 03/05/2019, 12:49 PM

## 2019-03-05 NOTE — Progress Notes (Signed)
Patient ID: Paul Brown, male   DOB: [birth year], 28 y.o.   MRN: [id number]   [place] NOVEL CORONAVIRUS (COVID-19) DAILY CHECK-OFF SYMPTOMS - answer yes or no to each - every day NO YES  Have you had a fever in the past 24 hours?  . Fever (Temp > 37.80C / 100F) X   Have you had any of these symptoms in the past 24 hours? . New Cough .  Sore Throat  .  Shortness of Breath .  Difficulty Breathing .  Unexplained Body Aches   X   Have you had any one of these symptoms in the past 24 hours not related to allergies?   . Runny Nose .  Nasal Congestion .  Sneezing   X   If you have had runny nose, nasal congestion, sneezing in the past 24 hours, has it worsened?  X   EXPOSURES - check yes or no X   Have you traveled outside the state in the past 14 days?  X   Have you been in contact with someone with a confirmed diagnosis of COVID-19 or PUI in the past 14 days without wearing appropriate PPE?  X   Have you been living in the same home as a person with confirmed diagnosis of COVID-19 or a PUI (household contact)?    X   Have you been diagnosed with COVID-19?    X              What to do next: Answered NO to all: Answered YES to anything:   Proceed with unit schedule Follow the [facility] Inpatient Flowsheet.

## 2019-03-05 NOTE — Plan of Care (Signed)
  Problem: Activity: Goal: Interest or engagement in leisure activities will improve Outcome: Progressing   Problem: Safety: Goal: Ability to remain free from injury will improve Outcome: Progressing   Problem: Coping: Goal: Coping ability will improve Outcome: Progressing   Problem: Medication: Goal: Compliance with prescribed medication regimen will improve Outcome: Progressing
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
## 2019-03-05 NOTE — [facility] Group Notes (Signed)
  [facility] LCSW Group Therapy Note  Date/Time:  03/05/2019 11:15AM-12:00PM  Type of Therapy and Topic:  Group Therapy:  Feelings About Hospitalization  Participation Level:  Active   Description of Group This process group involved patients discussing their feelings related to being hospitalized, as well as the benefits they see to being in the hospital.  These feelings and benefits were itemized.  The group then brainstormed specific ways in which they could seek those same benefits when they discharge and return home.  Therapeutic Goals 1. Patient will identify and describe positive and negative feelings related to hospitalization 2. Patient will verbalize benefits of hospitalization to themselves personally 3. Patients will brainstorm together ways they can obtain similar benefits in the outpatient setting, identify barriers to wellness and possible solutions  Summary of Patient Progress:  The patient expressed his primary feelings about being hospitalized are that he does not want to be in the hospital, has been hospitalized several times already and "it is the same thing over and over."  He said he has learned he has to rely on himself, and "can't keep being in my feelings all the time."  He was able to identify things that were good about being hospitalized, however.   Therapeutic Modalities Cognitive Behavioral Therapy Motivational Interviewing    Selmer Dominion, LCSW 03/05/2019, 2:23 PM

## 2019-03-05 NOTE — Progress Notes (Signed)
   03/05/19 2100  Psych Admission Type (Psych Patients Only)  Admission Status Involuntary  Psychosocial Assessment  Patient Complaints None  Eye Contact Fair  Facial Expression Anxious  Affect Appropriate to circumstance  Speech Logical/coherent  Interaction Isolative;Minimal  Motor Activity Other (Comment) (WNL)  Appearance/Hygiene Unremarkable  Behavior Characteristics Cooperative;Appropriate to situation  Mood Pleasant  Thought Process  Coherency WDL  Content WDL  Delusions None reported or observed  Perception WDL  Hallucination None reported or observed  Judgment Limited  Confusion None  Danger to Self  Current suicidal ideation? Denies  Danger to Others  Danger to Others None reported or observed

## 2019-03-06 MED ORDER — INFLUENZA VAC SPLIT QUAD 0.5 ML IM SUSY
0.5000 mL | PREFILLED_SYRINGE | INTRAMUSCULAR | Status: DC
  Filled 2019-03-06: qty 0.5

## 2019-03-06 MED ORDER — TEMAZEPAM 15 MG PO CAPS
15.0000 mg | ORAL_CAPSULE | Freq: Every day | ORAL | Status: DC
  Administered 2019-03-06 – 2019-03-08 (×3): 15 mg via ORAL
  Filled 2019-03-06 (×3): qty 1

## 2019-03-06 MED ORDER — TRAZODONE HCL 100 MG PO TABS
100.0000 mg | ORAL_TABLET | Freq: Every evening | ORAL | Status: DC | PRN
  Administered 2019-03-06 – 2019-03-08 (×3): 100 mg via ORAL
  Filled 2019-03-06 (×4): qty 1

## 2019-03-06 NOTE — Progress Notes (Signed)
   03/06/19 2130  Psych Admission Type (Psych Patients Only)  Admission Status Involuntary  Psychosocial Assessment  Patient Complaints None  Eye Contact Fair  Facial Expression Animated  Affect Appropriate to circumstance  Speech Logical/coherent  Interaction Isolative;Minimal  Motor Activity Other (Comment) (WNL)  Appearance/Hygiene Unremarkable  Behavior Characteristics Cooperative;Appropriate to situation  Mood Pleasant  Thought Process  Coherency WDL  Content WDL  Delusions None reported or observed  Perception WDL  Hallucination None reported or observed  Judgment Limited  Confusion None  Danger to Self  Current suicidal ideation? Denies  Danger to Others  Danger to Others None reported or observed

## 2019-03-06 NOTE — Progress Notes (Signed)
Adult Psychoeducational Group Note  Date:  03/06/2019 Time:  1:52 AM  Group Topic/Focus:  Wrap-Up Group:   The focus of this group is to help patients review their daily goal of treatment and discuss progress on daily workbooks.  Participation Level:  Minimal  Participation Quality:  Resistant  Affect:  Flat, Not Congruent and Resistant  Cognitive:  Disorganized and Confused  Insight: Lacking and Limited  Engagement in Group:  Lacking and Limited  Modes of Intervention:  Discussion  Additional Comments:  Pt stated his goal for today was to focus on his treatment plan. Pt stated he felt he accomplished his goal today. Pt stated his relationship with his family needs to be improved. Pt stated he felt better about himself today. Pt rated his overall day an 9 out of 10. Pt stated his appetite has improved today. Pt stated his goal for tonight was to get some rest. Pt stated he was in no physical pain today. Pt stated he was not hearing or seeing anything that was not there today. Pt stated he had no thoughts of harming himself or others today. Pt state if anything change he would alert staff.  Candy Sledge 03/06/2019, 1:52 AM

## 2019-03-06 NOTE — Plan of Care (Signed)
  Problem: Activity: Goal: Interest or engagement in leisure activities will improve Outcome: Progressing   Problem: Safety: Goal: Ability to remain free from injury will improve Outcome: Progressing   Problem: Coping: Goal: Coping ability will improve Outcome: Progressing   Problem: Medication: Goal: Compliance with prescribed medication regimen will improve Outcome: Progressing   

## 2019-03-06 NOTE — Progress Notes (Signed)
NUTRITION ASSESSMENT  Pt identified as at risk on the Malnutrition Screen Tool  INTERVENTION: 1. Supplements: Continue Ensure Enlive po BID, each supplement provides 350 kcal and 20 grams of protein  NUTRITION DIAGNOSIS: Unintentional weight loss related to sub-optimal intake as evidenced by pt report.   Goal: Pt to meet >/= 90% of their estimated nutrition needs.  Monitor:  PO intake  Assessment:  Pt admitted with schizoaffective disorder. Family reported pt has been using K2, which pt denies. UDS+ for THC. Pt is underweight and this has been consistent for some years. Ensure supplements have been ordered.   Height: Ht Readings from Last 1 Encounters:  03/03/19 5\' 5"  (1.651 m)    Weight: Wt Readings from Last 1 Encounters:  03/03/19 49.9 kg    Weight Hx: Wt Readings from Last 10 Encounters:  03/03/19 49.9 kg  06/21/14 51.3 kg  02/07/13 49 kg    BMI:  Body mass index is 18.3 kg/m. Pt meets criteria for underweight based on current BMI.  Estimated Nutritional Needs: Kcal: 25-30 kcal/kg Protein: > 1 gram protein/kg Fluid: 1 ml/kcal  Diet Order:  Diet Order            Diet regular Room service appropriate? Yes; Fluid consistency: Thin  Diet effective now             Pt is also offered choice of unit snacks mid-morning and mid-afternoon.  Pt is eating as desired.   Lab results and medications reviewed.   Clayton Bibles, MS, RD, LDN Inpatient Clinical Dietitian Pager: [phone] After Hours Pager: [phone]

## 2019-03-06 NOTE — Progress Notes (Signed)
Adult Psychoeducational Group Note  Date:  03/06/2019 Time:  10:48 PM  Group Topic/Focus:  Wrap-Up Group:   The focus of this group is to help patients review their daily goal of treatment and discuss progress on daily workbooks.  Participation Level:  Active  Participation Quality:  Appropriate  Affect:  Appropriate  Cognitive:  Appropriate  Insight: Appropriate  Engagement in Group:  Developing/Improving  Modes of Intervention:  Discussion  Additional Comments:  Pt stated his goal for today was to focus on his treatment plan and talk with his Education officer, museum. Pt stated he felt he accomplished his goal today. Pt stated he was able to talk with the Social Worker about how he can improved his relationship with his mother.  Pt stated his relationship with his family needs to be improved. Pt stated he felt better about himself today. Pt rated his overall day a 10. Pt stated his appetite was pretty good today. Pt stated his goal for tonight was to get some rest. Pt stated he was in physical pain today. Pt stated his right foot was in pain. Pt's nurse was made aware of the situation.  Pt stated he was not hearing or seeing anything that was not there today. Pt stated he had no thoughts of harming himself or others today. Pt state if anything change he would alert staff.   Candy Sledge 03/06/2019, 10:48 PM

## 2019-03-06 NOTE — Progress Notes (Signed)
Patient ID: Paul Brown, male   DOB: 04/22/1990, 28 y.o.   MRN: [id number]   [place] NOVEL CORONAVIRUS (COVID-19) DAILY CHECK-OFF SYMPTOMS - answer yes or no to each - every day NO YES  Have you had a fever in the past 24 hours?  . Fever (Temp > 37.80C / 100F) X   Have you had any of these symptoms in the past 24 hours? . New Cough .  Sore Throat  .  Shortness of Breath .  Difficulty Breathing .  Unexplained Body Aches   X   Have you had any one of these symptoms in the past 24 hours not related to allergies?   . Runny Nose .  Nasal Congestion .  Sneezing   X   If you have had runny nose, nasal congestion, sneezing in the past 24 hours, has it worsened?  X   EXPOSURES - check yes or no X   Have you traveled outside the state in the past 14 days?  X   Have you been in contact with someone with a confirmed diagnosis of COVID-19 or PUI in the past 14 days without wearing appropriate PPE?  X   Have you been living in the same home as a person with confirmed diagnosis of COVID-19 or a PUI (household contact)?    X   Have you been diagnosed with COVID-19?    X              What to do next: Answered NO to all: Answered YES to anything:   Proceed with unit schedule Follow the [facility] Inpatient Flowsheet.   
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
## 2019-03-06 NOTE — [facility] Group Notes (Signed)
[facility] LCSW Group Therapy Note  Date/Time:  03/06/2019  11:00AM-12:00PM  Type of Therapy and Topic:  Group Therapy:  Music and Mood  Participation Level:  Did Not Attend   Description of Group: In this process group, members listened to a variety of genres of music and identified that different types of music evoke different responses.  Patients were encouraged to identify music that was soothing for them and music that was energizing for them.  Patients discussed how this knowledge can help with wellness and recovery in various ways including managing depression and anxiety as well as encouraging healthy sleep habits.    Therapeutic Goals: 1. Patients will explore the impact of different varieties of music on mood 2. Patients will verbalize the thoughts they have when listening to different types of music 3. Patients will identify music that is soothing to them as well as music that is energizing to them 4. Patients will discuss how to use this knowledge to assist in maintaining wellness and recovery 5. Patients will explore the use of music as a coping skill  Summary of Patient Progress:  The patient was invited to attend group but chose not to do so.  Therapeutic Modalities: Solution Focused Brief Therapy Activity   Selmer Dominion, LCSW
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
## 2019-03-07 LAB — CBC WITH DIFFERENTIAL/PLATELET
Abs Immature Granulocytes: 0.01 10*3/uL (ref 0.00–0.07)
Basophils Absolute: 0 10*3/uL (ref 0.0–0.1)
Basophils Relative: 1 %
Eosinophils Absolute: 0.1 10*3/uL (ref 0.0–0.5)
Eosinophils Relative: 1 %
HCT: 45.4 % (ref 39.0–52.0)
Hemoglobin: 14.4 g/dL (ref 13.0–17.0)
Immature Granulocytes: 0 %
Lymphocytes Relative: 44 %
Lymphs Abs: 1.9 10*3/uL (ref 0.7–4.0)
MCH: 31.5 pg (ref 26.0–34.0)
MCHC: 31.7 g/dL (ref 30.0–36.0)
MCV: 99.3 fL (ref 80.0–100.0)
Monocytes Absolute: 0.4 10*3/uL (ref 0.1–1.0)
Monocytes Relative: 10 %
Neutro Abs: 1.9 10*3/uL (ref 1.7–7.7)
Neutrophils Relative %: 44 %
Platelets: 209 10*3/uL (ref 150–400)
RBC: 4.57 MIL/uL (ref 4.22–5.81)
RDW: 12.5 % (ref 11.5–15.5)
WBC: 4.3 10*3/uL (ref 4.0–10.5)
nRBC: 0 % (ref 0.0–0.2)

## 2019-03-07 LAB — HEPATIC FUNCTION PANEL
ALT: 36 U/L (ref 0–44)
AST: 29 U/L (ref 15–41)
Albumin: 4.2 g/dL (ref 3.5–5.0)
Alkaline Phosphatase: 50 U/L (ref 38–126)
Bilirubin, Direct: 0.1 mg/dL (ref 0.0–0.2)
Indirect Bilirubin: 0.5 mg/dL (ref 0.3–0.9)
Total Bilirubin: 0.6 mg/dL (ref 0.3–1.2)
Total Protein: 7.3 g/dL (ref 6.5–8.1)

## 2019-03-07 LAB — CARBAMAZEPINE LEVEL, TOTAL: Carbamazepine Lvl: 3.2 ug/mL — ABNORMAL LOW (ref 4.0–12.0)

## 2019-03-07 NOTE — Progress Notes (Signed)
Recreation Therapy Notes  Date: 11.30.20 Time: 1000 Location: 500 Hall Dayroom  Group Topic: Wellness  Goal Area(s) Addresses:  Patient will define components of whole wellness. Patient will verbalize benefit of whole wellness.  Intervention:  Music  Activity: Exercise.  LRT led patients in a series of stretches to get them loosened up for group.  Each patient gets the chance to lead the group in an exercise of their choosing.  The group is to complete at least 30 minutes of exercise.  Patients could take breaks or get water as needed.  Education: Wellness, Dentist.   Education Outcome: Acknowledges education/In group clarification offered/Needs additional education.   Clinical Observations/Feedback:  Pt did not attend group.    Victorino Sparrow, LRT/CTRS         Victorino Sparrow A 03/07/2019 11:33 AM

## 2019-03-07 NOTE — Progress Notes (Signed)
   03/07/19 2200  Psych Admission Type (Psych Patients Only)  Admission Status Involuntary  Psychosocial Assessment  Patient Complaints None  Eye Contact Fair  Facial Expression Animated  Affect Appropriate to circumstance  Speech Logical/coherent  Interaction Isolative;Minimal  Motor Activity Other (Comment) (WNL)  Appearance/Hygiene Unremarkable  Behavior Characteristics Cooperative  Thought Process  Coherency WDL  Content WDL  Delusions None reported or observed  Perception WDL  Hallucination None reported or observed  Judgment Limited  Confusion None  Danger to Self  Current suicidal ideation? Denies  Danger to Others  Danger to Others None reported or observed

## 2019-03-07 NOTE — Progress Notes (Signed)
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 Anxiety depression  . Bipolar 1 disorder ([facility])   . Depression   . Mood disorder ([facility]) anxiety   History reviewed. No pertinent surgical history. Family History: History reviewed. No pertinent family history. Family Psychiatric  History: see eval Social History:  Social History   Substance and Sexual Activity  Alcohol Use No     Social History   Substance and Sexual Activity  Drug Use No    Social History   Socioeconomic History  . Marital status: Single    Spouse name: Not on file  . Number of children: Not on file  . Years of education: Not on file  . Highest education level: Not on file  Occupational History  . Not on file  Social Needs  . Financial resource strain: Not on file  . Food insecurity    Worry: Not on file    Inability: Not on file  . Transportation needs    Medical: Not on file    Non-medical: Not on file  Tobacco Use  . Smoking status: Current Every Day Smoker    Packs/day: 0.50  . Smokeless tobacco: Never Used  Substance and Sexual Activity  . Alcohol use: No  . Drug use: No  . Sexual activity: Yes    Birth control/protection: Condom  Lifestyle  . Physical activity    Days per week: Not on file    Minutes per session: Not  on file  . Stress: Not on file  Relationships  . Social Musicianconnections    Talks on phone: Not on file    Gets together: Not on file    Attends religious service: Not on file    Active member of club or organization: Not on file    Attends meetings of clubs or organizations: Not on file    Relationship status: Not on file  Other Topics Concern  . Not on file  Social History Narrative  . Not on file   Additional Social History:    Prescriptions: Tegretol & Zyprexa .  reports taking Tegretol morning and night.  Zypreza for sleep. Over the Counter: None History of alcohol / drug use?: Yes Name of Substance 1: CBD 1 - Age of First Use: unknown 1 - Amount (size/oz): Varies 1 - Frequency: "Whenever I have muscle aches or anxiety 1 - Duration: off and on 1 - Last Use / Amount: Today                  Sleep: Fair  Appetite:  Fair  Current Medications: Current Facility-Administered Medications  Medication Dose Route Frequency Provider Last Rate Last Dose  . acetaminophen (TYLENOL) tablet 650 mg  650 mg Oral Q6H PRN Anike, Adaku C, NP   650 mg at 03/06/19 2125  . alum & mag hydroxide-simeth (  MAALOX/MYLANTA) 200-200-20 MG/5ML suspension 30 mL  30 mL Oral Q4H PRN Anike, Adaku C, NP      . carbamazepine (TEGRETOL) chewable tablet 100 mg  100 mg Oral BID Johnn Hai, MD   100 mg at 03/07/19 0804  . feeding supplement (ENSURE ENLIVE) (ENSURE ENLIVE) liquid 237 mL  237 mL Oral BID BM Anike, Adaku C, NP   237 mL at 03/07/19 0804  . hydrOXYzine (ATARAX/VISTARIL) tablet 25 mg  25 mg Oral TID PRN Anike, Adaku C, NP   25 mg at 03/06/19 0142  . influenza vac split quadrivalent PF (FLUARIX) injection 0.5 mL  0.5 mL Intramuscular Tomorrow-1000 Johnn Hai, MD      . magnesium hydroxide (MILK OF MAGNESIA) suspension 30 mL  30 mL Oral Daily PRN Anike, Adaku C, NP      . OLANZapine (ZYPREXA) tablet 5 mg  5 mg Oral QHS Derrill Center, NP   5 mg at 03/06/19 2126  . temazepam (RESTORIL) capsule 15  mg  15 mg Oral QHS Connye Burkitt, NP   15 mg at 03/06/19 2126  . traZODone (DESYREL) tablet 100 mg  100 mg Oral QHS PRN Connye Burkitt, NP   100 mg at 03/06/19 2125    Lab Results:  Results for orders placed or performed during the hospital encounter of 03/02/19 (from the past 48 hour(s))  CBC with Differential/Platelet     Status: None   Collection Time: 03/07/19  6:50 AM  Result Value Ref Range   WBC 4.3 4.0 - 10.5 K/uL   RBC 4.57 4.22 - 5.81 MIL/uL   Hemoglobin 14.4 13.0 - 17.0 g/dL   HCT 45.4 39.0 - 52.0 %   MCV 99.3 80.0 - 100.0 fL   MCH 31.5 26.0 - 34.0 pg   MCHC 31.7 30.0 - 36.0 g/dL   RDW 12.5 11.5 - 15.5 %   Platelets 209 150 - 400 K/uL   nRBC 0.0 0.0 - 0.2 %   Neutrophils Relative % 44 %   Neutro Abs 1.9 1.7 - 7.7 K/uL   Lymphocytes Relative 44 %   Lymphs Abs 1.9 0.7 - 4.0 K/uL   Monocytes Relative 10 %   Monocytes Absolute 0.4 0.1 - 1.0 K/uL   Eosinophils Relative 1 %   Eosinophils Absolute 0.1 0.0 - 0.5 K/uL   Basophils Relative 1 %   Basophils Absolute 0.0 0.0 - 0.1 K/uL   Immature Granulocytes 0 %   Abs Immature Granulocytes 0.01 0.00 - 0.07 K/uL    Comment: Performed at [facility], [place] [address] [place], [place] [address]    Blood Alcohol level:  Lab Results  Component Value Date   [facility] <10 03/03/2019   ETH <5 53/97/6734    Metabolic Disorder Labs: Lab Results  Component Value Date   HGBA1C 5.8 (H) 03/03/2019   MPG 119.76 03/03/2019   MPG 123 06/24/2014   Lab Results  Component Value Date   PROLACTIN 6.0 03/03/2019   Lab Results  Component Value Date   CHOL 136 03/03/2019   TRIG 91 03/03/2019   HDL 53 03/03/2019   CHOLHDL 2.6 03/03/2019   VLDL 18 03/03/2019   LDLCALC 65 03/03/2019   LDLCALC 91 06/24/2014    Physical Findings: AIMS: Facial and Oral Movements Muscles of Facial Expression: None, normal Lips and Perioral Area: None, normal Jaw: None, normal Tongue: None, normal,Extremity Movements Upper (arms,  wrists, hands, fingers): None, normal Lower (legs, knees, ankles, toes): None, normal, Trunk  Movements Neck, shoulders, hips: None, normal, Overall Severity Severity of abnormal movements (highest score from questions above): None, normal Incapacitation due to abnormal movements: None, normal Patient's awareness of abnormal movements (rate only patient's report): No Awareness, Dental Status Current problems with teeth and/or dentures?: No Does patient usually wear dentures?: No  CIWA:  CIWA-Ar Total: 0 COWS:  COWS Total Score: 2  Musculoskeletal: Strength & Muscle Tone: within normal limits Gait & Station: normal Patient leans: N/A  Psychiatric Specialty Exam: Physical Exam  ROS  Blood pressure (!) 146/92, pulse 93, temperature (!) 97.3 F (36.3 C), temperature source Oral, resp. rate 18, height 5\' 5"  (1.651 m), weight 49.9 kg, SpO2 100 %.Body mass index is 18.3 kg/m.  General Appearance: Casual  Eye Contact:  Good  Speech:  Clear and Coherent  Volume:  Normal  Mood:  Hypomanic at times  Affect:  Congruent  Thought Process:  Irrelevant and Descriptions of Associations: Loose  Orientation:  Full (Time, Place, and Person)  Thought Content:  Logical  Suicidal Thoughts:  No  Homicidal Thoughts:  No  Memory:  Immediate;   Fair Recent;   Fair Remote;   Fair  Judgement:  Fair  Insight:  Fair  Psychomotor Activity:  Normal  Concentration:  Concentration: Fair and Attention Span: Good  Recall:  of Knowledge:  Fair  Language:  Fair  Akathisia:  Negative  Handed:  Right  AIMS (if indicated):     Assets:  Physical Health Resilience  ADL's:  Intact  Cognition:  WNL  Sleep:  Number of Hours: 6.75     Treatment Plan Summary: Daily contact with patient to assess and evaluate symptoms and progress in treatment and Medication management  Continue antipsychotic therapy Reality based 1-1 no change sin precautions Discuss w team/family  [employer], MD 03/07/2019,  8:39 AM

## 2019-03-08 MED ORDER — OLANZAPINE 7.5 MG PO TABS
15.0000 mg | ORAL_TABLET | Freq: Every day | ORAL | Status: DC
  Administered 2019-03-08: 15 mg via ORAL
  Filled 2019-03-08 (×2): qty 2

## 2019-03-08 MED ORDER — ARIPIPRAZOLE 2 MG PO TABS
2.0000 mg | ORAL_TABLET | Freq: Once | ORAL | Status: AC
  Administered 2019-03-08: 2 mg via ORAL
  Filled 2019-03-08: qty 1

## 2019-03-08 NOTE — Progress Notes (Signed)
Recreation Therapy Notes  Date: 12.1.20 Time: 1000 Location: 500 Hall Dayroom  Group Topic: Coping Skills  Goal Area(s) Addresses:  Patient will identify positive coping skills. Patient will identify benefit of using coping skills post d/c.  Behavioral Response: Engaged  Intervention: Worksheet, pencils  Activity: Mind Map.  LRT and patients filled in the first 8 boxes of the mind map with being honest, anxiety, loss of job, having kids, school, work, depression and losing someone.  Patients were then given time to come up with at least 3 coping skills for each situation identified.   Education: Radiographer, therapeutic, Dentist.   Education Outcome: Acknowledges understanding/In group clarification offered/Needs additional education.   Clinical Observations/Feedback: Pt was attentive and active throughout group session.  Pt identified coping skills as volunteer, trust, studying, diet, exercise, punctuality, understanding other's point of view, video games, various games, prayer, church, spread the word and smile.   Victorino Sparrow, LRT/CTRS         Victorino Sparrow A 03/08/2019 11:27 AM

## 2019-03-08 NOTE — Progress Notes (Signed)
[facility] MD Progress Note  03/08/2019 12:34 PM Paul Brown  MRN:  [id number] Subjective:   Patient's discharge planning involves simply getting a hotel stating he has "money" but will not specify how much.  He seems to have more negative symptoms denies hallucinations continues to minimize the circumstances of the hospitalization altogether.  But denies wanting to harm self or others.  Denies any side effects from his meds no EPS or TD Principal Problem: Schizoaffective disorder, bipolar type (HCC) Diagnosis: Principal Problem:   Schizoaffective disorder, bipolar type ([place])  Total Time spent with patient: 20 minutes  Past Psychiatric History: Discussed long-acting injectable does not believe he has had experience with that in the past Tried Tegretol Depakote and olanzapine in the past Past Medical History:  Past Medical History:  Diagnosis Date  . Anxiety depression  . Bipolar 1 disorder ([place])   . Depression   . Mood disorder ([place]) anxiety   History reviewed. No pertinent surgical history. Family History: History reviewed. No pertinent family history. Family Psychiatric  History: No new data Social History:  Social History   Substance and Sexual Activity  Alcohol Use No     Social History   Substance and Sexual Activity  Drug Use No    Social History   Socioeconomic History  . Marital status: Single    Spouse name: Not on file  . Number of children: Not on file  . Years of education: Not on file  . Highest education level: Not on file  Occupational History  . Not on file  Social Needs  . Financial resource strain: Not on file  . Food insecurity    Worry: Not on file    Inability: Not on file  . Transportation needs    Medical: Not on file    Non-medical: Not on file  Tobacco Use  . Smoking status: Current Every Day Smoker    Packs/day: 0.50  . Smokeless tobacco: Never Used  Substance and Sexual Activity  . Alcohol use: No  . Drug use: No  . Sexual activity:  Yes    Birth control/protection: Condom  Lifestyle  . Physical activity    Days per week: Not on file    Minutes per session: Not on file  . Stress: Not on file  Relationships  . Social Herbalist on phone: Not on file    Gets together: Not on file    Attends religious service: Not on file    Active member of club or organization: Not on file    Attends meetings of clubs or organizations: Not on file    Relationship status: Not on file  Other Topics Concern  . Not on file  Social History Narrative  . Not on file   Additional Social History:    Prescriptions: Tegretol & Zyprexa .  reports taking Tegretol morning and night.  Zypreza for sleep. Over the Counter: None History of alcohol / drug use?: Yes Name of Substance 1: CBD 1 - Age of First Use: unknown 1 - Amount (size/oz): Varies 1 - Frequency: "Whenever I have muscle aches or anxiety 1 - Duration: off and on 1 - Last Use / Amount: Today                  Sleep: fair  Appetite:  Fair  Current Medications: Current Facility-Administered Medications  Medication Dose Route Frequency Provider Last Rate Last Dose  . acetaminophen (TYLENOL) tablet 650 mg  650 mg Oral  Q6H PRN Anike, Adaku C, NP   650 mg at 03/06/19 2125  . alum & mag hydroxide-simeth (MAALOX/MYLANTA) 200-200-20 MG/5ML suspension 30 mL  30 mL Oral Q4H PRN Anike, Adaku C, NP      . ARIPiprazole (ABILIFY) tablet 2 mg  2 mg Oral Once Malvin JohnsFarah, Laurelai Lepp, MD      . carbamazepine (TEGRETOL) chewable tablet 100 mg  100 mg Oral BID Malvin JohnsFarah, Sherika Kubicki, MD   100 mg at 03/08/19 0805  . feeding supplement (ENSURE ENLIVE) (ENSURE ENLIVE) liquid 237 mL  237 mL Oral BID BM Anike, Adaku C, NP   237 mL at 03/07/19 0804  . hydrOXYzine (ATARAX/VISTARIL) tablet 25 mg  25 mg Oral TID PRN Anike, Adaku C, NP   25 mg at 03/06/19 0142  . influenza vac split quadrivalent PF (FLUARIX) injection 0.5 mL  0.5 mL Intramuscular Tomorrow-1000 Malvin JohnsFarah, Aladdin Kollmann, MD      . magnesium hydroxide  (MILK OF MAGNESIA) suspension 30 mL  30 mL Oral Daily PRN Anike, Adaku C, NP      . OLANZapine (ZYPREXA) tablet 15 mg  15 mg Oral QHS Malvin JohnsFarah, Quintavious Rinck, MD      . temazepam (RESTORIL) capsule 15 mg  15 mg Oral QHS Aldean BakerSykes, Janet E, NP   15 mg at 03/07/19 2113  . traZODone (DESYREL) tablet 100 mg  100 mg Oral QHS PRN Aldean BakerSykes, Janet E, NP   100 mg at 03/07/19 2113    Lab Results:  Results for orders placed or performed during the hospital encounter of 03/02/19 (from the past 48 hour(s))  Carbamazepine level, total     Status: Abnormal   Collection Time: 03/07/19  6:50 AM  Result Value Ref Range   Carbamazepine Lvl 3.2 (L) 4.0 - 12.0 ug/mL    Comment: Performed at [facility], [address] [place], [place] [address]  CBC with Differential/Platelet     Status: None   Collection Time: 03/07/19  6:50 AM  Result Value Ref Range   WBC 4.3 4.0 - 10.5 K/uL   RBC 4.57 4.22 - 5.81 MIL/uL   Hemoglobin 14.4 13.0 - 17.0 g/dL   HCT 60.445.4 54.039.0 - 98.152.0 %   MCV 99.3 80.0 - 100.0 fL   MCH 31.5 26.0 - 34.0 pg   MCHC 31.7 30.0 - 36.0 g/dL   RDW 19.112.5 47.811.5 - 29.515.5 %   Platelets 209 150 - 400 K/uL   nRBC 0.0 0.0 - 0.2 %   Neutrophils Relative % 44 %   Neutro Abs 1.9 1.7 - 7.7 K/uL   Lymphocytes Relative 44 %   Lymphs Abs 1.9 0.7 - 4.0 K/uL   Monocytes Relative 10 %   Monocytes Absolute 0.4 0.1 - 1.0 K/uL   Eosinophils Relative 1 %   Eosinophils Absolute 0.1 0.0 - 0.5 K/uL   Basophils Relative 1 %   Basophils Absolute 0.0 0.0 - 0.1 K/uL   Immature Granulocytes 0 %   Abs Immature Granulocytes 0.01 0.00 - 0.07 K/uL    Comment: Performed at [facility], [address] [place], [place] [address]  Hepatic function panel     Status: None   Collection Time: 03/07/19  6:50 AM  Result Value Ref Range   Total Protein 7.3 6.5 - 8.1 g/dL   Albumin 4.2 3.5 - 5.0 g/dL   AST 29 15 - 41 U/L   ALT 36 0 - 44 U/L   Alkaline Phosphatase 50 38 - 126 U/L   Total  Bilirubin 0.6 0.3 - 1.2 mg/dL    Bilirubin, Direct 0.1 0.0 - 0.2 mg/dL   Indirect Bilirubin 0.5 0.3 - 0.9 mg/dL    Comment: Performed at [facility], [address] [place], [place] [address]    Blood Alcohol level:  Lab Results  Component Value Date   [facility] <10 03/03/2019   ETH <5 06/21/2014    Metabolic Disorder Labs: Lab Results  Component Value Date   HGBA1C 5.8 (H) 03/03/2019   MPG 119.76 03/03/2019   MPG 123 06/24/2014   Lab Results  Component Value Date   PROLACTIN 6.0 03/03/2019   Lab Results  Component Value Date   CHOL 136 03/03/2019   TRIG 91 03/03/2019   HDL 53 03/03/2019   CHOLHDL 2.6 03/03/2019   VLDL 18 03/03/2019   LDLCALC 65 03/03/2019   LDLCALC 91 06/24/2014    Physical Findings: AIMS: Facial and Oral Movements Muscles of Facial Expression: None, normal Lips and Perioral Area: None, normal Jaw: None, normal Tongue: None, normal,Extremity Movements Upper (arms, wrists, hands, fingers): None, normal Lower (legs, knees, ankles, toes): None, normal, Trunk Movements Neck, shoulders, hips: None, normal, Overall Severity Severity of abnormal movements (highest score from questions above): None, normal Incapacitation due to abnormal movements: None, normal Patient's awareness of abnormal movements (rate only patient's report): No Awareness, Dental Status Current problems with teeth and/or dentures?: No Does patient usually wear dentures?: No  CIWA:  CIWA-Ar Total: 0 COWS:  COWS Total Score: 2  Musculoskeletal: Strength & Muscle Tone: within normal limits Gait & Station: normal Patient leans: N/A  Psychiatric Specialty Exam: Physical Exam  ROS  Blood pressure (!) 135/96, pulse 98, temperature 97.7 F (36.5 C), temperature source Oral, resp. rate 18, height 5\' 5"  (1.651 m), weight 49.9 kg, SpO2 100 %.Body mass index is 18.3 kg/m.  General Appearance: Casual  Eye Contact:  Good  Speech:  Clear and Coherent  Volume:  Normal  Mood:  Euthymic  Affect:   Appropriate  Thought Process:  Coherent, Linear and Descriptions of Associations: Circumstantial  Orientation:  Full (Time, Place, and Person)  Thought Content:  No difficulty processing just not making meaningful plans or realistic plans as best I can tell  Suicidal Thoughts:  No  Homicidal Thoughts:  No  Memory:  Immediate;   Fair Recent;   Fair Remote;   Fair  Judgement:  Fair  Insight:  Fair  Psychomotor Activity:  Normal  Concentration:  Concentration: Fair and Attention Span: Fair  Recall:  of Knowledge:  Fair  Language:  Fair  Akathisia:  Negative  Handed:  Right  AIMS (if indicated):     Assets:  Physical Health Resilience  ADL's:  Intact  Cognition:  WNL  Sleep:  Number of Hours: 6.75     Treatment Plan Summary: Daily contact with patient to assess and evaluate symptoms and progress in treatment and Medication management  Continue current precautions Escalate olanzapine therapy to 15 mg at bedtime Give test dose of aripiprazole in anticipation of long-acting injectable Continue reality based therapy continue to help with discharge planning  [employer], MD 03/08/2019, 12:34 PM

## 2019-03-08 NOTE — Progress Notes (Addendum)
CSW spoke with mother, Minda Meo [phone]. Mother wanted updates regarding patient's progress and his ability to sleep soundly.   CSW discussed a potential discharge tomorrow with mother, mother is agreeable. She reports patient is able to return to her home temporarily, and she will coordinate with the patient's aunt to assist him in finding his own place next month.  Mother is aware that patient has an appointment at [facility] with his provider at 1:15pm. Mother plans to pick patient up with his father tomorrow morning and the family will all go to the appointment.    Update 9:45am  CSW spoke with patient on unit. He expresses readiness to discharge tomorrow. He is aware of his [facility] appointment and he reports that he is looking forward to meeting with his provider. Patient states he does not intend to speak with his mother or return home. He states he will stay in a motel or find other arrangements at discharge.  Stephanie Acre, MSW, [place] Social Worker [facility] Adult Unit  [phone]

## 2019-03-08 NOTE — Progress Notes (Signed)
Patient was noted to be dancing and singing in the hallway.  Patient was pacing the unit and talking to staff and peers in an appropriate manner.  Patient has been compliant with medications, attended groups and has had no incidents of behavioral dyscontrol.   Assess patient for safety, offer medications as prescribed, engage patient in 1:1 staff talks.   Patient able to contract for safety. Continue to monitor as planned.

## 2019-03-08 NOTE — Progress Notes (Deleted)
   03/08/19 2100  Psych Admission Type (Psych Patients Only)  Admission Status Involuntary  Psychosocial Assessment  Patient Complaints None  Eye Contact Fair  Facial Expression Animated  Affect Appropriate to circumstance  Speech Logical/coherent  Interaction Isolative;Minimal  Motor Activity Other (Comment) (WNL)  Appearance/Hygiene Unremarkable  Behavior Characteristics Cooperative  Mood Pleasant  Thought Process  Coherency WDL  Content WDL  Delusions None reported or observed  Perception WDL  Hallucination None reported or observed  Judgment Limited  Confusion None  Danger to Self  Current suicidal ideation? Denies  Danger to Others  Danger to Others None reported or observed   Pt stated he was doing better, pt ready to D/C tomorrow

## 2019-03-08 NOTE — Progress Notes (Signed)
   03/08/19 2100  Psych Admission Type (Psych Patients Only)  Admission Status Involuntary  Psychosocial Assessment  Patient Complaints None  Eye Contact Fair  Facial Expression Animated  Affect Appropriate to circumstance  Speech Logical/coherent  Interaction Isolative;Minimal  Motor Activity Other (Comment) (WNL)  Appearance/Hygiene Unremarkable  Behavior Characteristics Cooperative  Mood Pleasant  Thought Process  Coherency WDL  Content WDL  Delusions None reported or observed  Perception WDL  Hallucination None reported or observed  Judgment Limited  Confusion None  Danger to Self  Current suicidal ideation? Denies  Danger to Others  Danger to Others None reported or observed

## 2019-03-08 NOTE — Progress Notes (Signed)
Adult Psychoeducational Group Note  Date:  03/08/2019 Time:  1:22 AM  Group Topic/Focus:  Wrap-Up Group:   The focus of this group is to help patients review their daily goal of treatment and discuss progress on daily workbooks.  Participation Level:  Active  Participation Quality:  Appropriate  Affect:  Appropriate  Cognitive:  Appropriate  Insight: Appropriate  Engagement in Group:  Developing/Improving  Modes of Intervention:  Discussion  Additional Comments:  Pt stated his goal for today was to focus on his treatment plan and try to keep a positive attitude towards his treatment . Pt stated he felt he accomplished his goal today. Pt stated his relationship with his family needs to be improved. Pt stated he felt better about himself today. Pt rated his overall day a 10. Pt stated his appetite was pretty good today. Pt stated his goal for tonight was to get some rest. Pt stated he was no physical pain today. Pt stated he was not hearing or seeing anything that was not there today. Pt stated he had no thoughts of harming himself or others today. Pt state if anything change he would alert staff.   Candy Sledge 03/08/2019, 1:22 AM

## 2019-03-09 MED ORDER — CARBAMAZEPINE 100 MG PO CHEW: 100.0000 mg | CHEWABLE_TABLET | Freq: Two times a day (BID) | ORAL | 0 refills | Status: DC

## 2019-03-09 MED ORDER — TRAZODONE HCL 100 MG PO TABS: 100.0000 mg | ORAL_TABLET | Freq: Every evening | ORAL | 0 refills | Status: DC | PRN

## 2019-03-09 MED ORDER — HYDROXYZINE HCL 25 MG PO TABS: 25.0000 mg | ORAL_TABLET | Freq: Three times a day (TID) | ORAL | 0 refills | Status: AC | PRN

## 2019-03-09 MED ORDER — OLANZAPINE 15 MG PO TABS: 15.0000 mg | ORAL_TABLET | Freq: Every day | ORAL | 0 refills | Status: DC

## 2019-03-09 MED ORDER — TEMAZEPAM 15 MG PO CAPS: 15.0000 mg | ORAL_CAPSULE | Freq: Every day | ORAL | 0 refills | Status: DC

## 2019-03-09 NOTE — Progress Notes (Signed)
Patient ID: Paul Brown, male   DOB: 1990-08-26, 28 y.o.   MRN: [id number] Patient discharged to home/self care on his own accord.  Patient denies SI, HI and AVH upon discharge.  Patient acknowledged understanding of all discharge instructions and receipt of personal belongings.
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
## 2019-03-09 NOTE — Progress Notes (Signed)
  [facility] Adult Case Management Discharge Plan :  Will you be returning to the same living situation after discharge:  No. Going to stay in a hotel. At discharge, do you have transportation home?: Yes,  Lyft. Do you have the ability to pay for your medications: No. Following up with [facility].  Release of information consent forms completed and in the chart. Letter on chart.  Patient to Follow up at: Follow-up Information    [facility]. Go on 03/09/2019.   Why: Your hospital discharge appointment is scheduled for 12/02 at 1:15 with [facility]. The appointment will be held in person, wear a mask! Bring your hospital discharge paperwork, photo ID, current medications, and proof of income or insurance.  Contact information: [place] [address] [phone]           Next level of care provider has access to [place] and Suicide Prevention discussed: Yes,  with mother.   Has patient been referred to the Quitline?: Patient refused referral  Patient has been referred for addiction treatment: Yes  Joellen Jersey, [place] 03/09/2019, 9:15 AM

## 2019-03-09 NOTE — Tx Team (Signed)
Interdisciplinary Treatment and Diagnostic Plan Update  03/09/2019 Time of Session: 10:05am Paul Brown MRN: [id number]  Principal Diagnosis: Schizoaffective disorder, bipolar type [facility])  Secondary Diagnoses: Principal Problem:   Schizoaffective disorder, bipolar type (HCC)   Current Medications:  Current Facility-Administered Medications  Medication Dose Route Frequency Provider Last Rate Last Dose  . acetaminophen (TYLENOL) tablet 650 mg  650 mg Oral Q6H PRN Anike, Adaku C, NP   650 mg at 03/06/19 2125  . alum & mag hydroxide-simeth (MAALOX/MYLANTA) 200-200-20 MG/5ML suspension 30 mL  30 mL Oral Q4H PRN Anike, Adaku C, NP      . carbamazepine (TEGRETOL) chewable tablet 100 mg  100 mg Oral BID Malvin Johns, MD   100 mg at 03/09/19 0825  . feeding supplement (ENSURE ENLIVE) (ENSURE ENLIVE) liquid 237 mL  237 mL Oral BID BM Anike, Adaku C, NP   237 mL at 03/07/19 0804  . hydrOXYzine (ATARAX/VISTARIL) tablet 25 mg  25 mg Oral TID PRN Anike, Adaku C, NP   25 mg at 03/06/19 0142  . influenza vac split quadrivalent PF (FLUARIX) injection 0.5 mL  0.5 mL Intramuscular Tomorrow-1000 Malvin Johns, MD      . magnesium hydroxide (MILK OF MAGNESIA) suspension 30 mL  30 mL Oral Daily PRN Anike, Adaku C, NP      . OLANZapine (ZYPREXA) tablet 15 mg  15 mg Oral QHS Malvin Johns, MD   15 mg at 03/08/19 2100  . temazepam (RESTORIL) capsule 15 mg  15 mg Oral QHS Aldean Baker, NP   15 mg at 03/08/19 2100  . traZODone (DESYREL) tablet 100 mg  100 mg Oral QHS PRN Aldean Baker, NP   100 mg at 03/08/19 2100   PTA Medications: Medications Prior to Admission  Medication Sig Dispense Refill Last Dose  . OLANZapine zydis (ZYPREXA) 15 MG disintegrating tablet Take 1 tablet (15 mg total) by mouth at bedtime. (Patient not taking: Reported on 03/15/2015) 30 tablet 0   . traZODone (DESYREL) 50 MG tablet Take 1 tablet (50 mg total) by mouth at bedtime as needed and may repeat dose one time if needed for sleep.  (Patient not taking: Reported on 03/03/2019) 60 tablet 0 Not Taking at Unknown time  . [DISCONTINUED] carbamazepine (TEGRETOL) 100 MG chewable tablet Chew 1 tablet (100 mg total) by mouth 2 (two) times daily. (Patient not taking: Reported on 03/15/2015) 60 tablet 0     Patient Stressors: Paediatric nurse issue Marital or family conflict Substance abuse  Patient Strengths: Active sense of humor Supportive family/friends  Treatment Modalities: Medication Management, Group therapy, Case management,  1 to 1 session with clinician, Psychoeducation, Recreational therapy.   Physician Treatment Plan for Primary Diagnosis: Schizoaffective disorder, bipolar type (HCC) Long Term Goal(s): Improvement in symptoms so as ready for discharge Improvement in symptoms so as ready for discharge   Short Term Goals: Ability to identify changes in lifestyle to reduce recurrence of condition will improve Ability to verbalize feelings will improve Ability to disclose and discuss suicidal ideas Ability to demonstrate self-control will improve Ability to identify and develop effective coping behaviors will improve Ability to maintain clinical measurements within normal limits will improve Compliance with prescribed medications will improve Ability to identify changes in lifestyle to reduce recurrence of condition will improve Ability to verbalize feelings will improve Ability to disclose and discuss suicidal ideas Ability to demonstrate self-control will improve Ability to identify and develop effective coping behaviors will improve Ability to maintain clinical measurements  within normal limits will improve  Medication Management: Evaluate patient's response, side effects, and tolerance of medication regimen.  Therapeutic Interventions: 1 to 1 sessions, Unit Group sessions and Medication administration.  Evaluation of Outcomes: Adequate for Discharge  Physician Treatment Plan for Secondary  Diagnosis: Principal Problem:   Schizoaffective disorder, bipolar type (HCC)  Long Term Goal(s): Improvement in symptoms so as ready for discharge Improvement in symptoms so as ready for discharge   Short Term Goals: Ability to identify changes in lifestyle to reduce recurrence of condition will improve Ability to verbalize feelings will improve Ability to disclose and discuss suicidal ideas Ability to demonstrate self-control will improve Ability to identify and develop effective coping behaviors will improve Ability to maintain clinical measurements within normal limits will improve Compliance with prescribed medications will improve Ability to identify changes in lifestyle to reduce recurrence of condition will improve Ability to verbalize feelings will improve Ability to disclose and discuss suicidal ideas Ability to demonstrate self-control will improve Ability to identify and develop effective coping behaviors will improve Ability to maintain clinical measurements within normal limits will improve     Medication Management: Evaluate patient's response, side effects, and tolerance of medication regimen.  Therapeutic Interventions: 1 to 1 sessions, Unit Group sessions and Medication administration.  Evaluation of Outcomes: Adequate for Discharge   RN Treatment Plan for Primary Diagnosis: Schizoaffective disorder, bipolar type (HCC) Long Term Goal(s): Knowledge of disease and therapeutic regimen to maintain health will improve  Short Term Goals: Ability to participate in decision making will improve, Ability to verbalize feelings will improve, Ability to disclose and discuss suicidal ideas, Ability to identify and develop effective coping behaviors will improve and Compliance with prescribed medications will improve  Medication Management: RN will administer medications as ordered by provider, will assess and evaluate patient's response and provide education to patient for  prescribed medication. RN will report any adverse and/or side effects to prescribing provider.  Therapeutic Interventions: 1 on 1 counseling sessions, Psychoeducation, Medication administration, Evaluate responses to treatment, Monitor vital signs and CBGs as ordered, Perform/monitor CIWA, COWS, AIMS and Fall Risk screenings as ordered, Perform wound care treatments as ordered.  Evaluation of Outcomes: Adequate for Discharge   LCSW Treatment Plan for Primary Diagnosis: Schizoaffective disorder, bipolar type (HCC) Long Term Goal(s): Safe transition to appropriate next level of care at discharge, Engage patient in therapeutic group addressing interpersonal concerns.  Short Term Goals: Engage patient in aftercare planning with referrals and resources, Increase social support and Increase skills for wellness and recovery  Therapeutic Interventions: Assess for all discharge needs, 1 to 1 time with Social worker, Explore available resources and support systems, Assess for adequacy in community support network, Educate family and significant other(s) on suicide prevention, Complete Psychosocial Assessment, Interpersonal group therapy.  Evaluation of Outcomes: Adequate for Discharge   Progress in Treatment: Attending groups: Yes. Participating in groups: Yes. Taking medication as prescribed: Yes. Toleration medication: Yes. Family/Significant other contact made: Yes, individual(s) contacted:  mother Patient understands diagnosis: No. Discussing patient identified problems/goals with staff: Yes. Medical problems stabilized or resolved: Yes. Denies suicidal/homicidal ideation: Yes. Issues/concerns per patient self-inventory: No. Other:   New problem(s) identified: No, Describe:  None  New Short Term/Long Term Goal(s): Medication stabilization, elimination of SI thoughts, and development of a comprehensive mental wellness plan.   Patient Goals:    Discharge Plan or Barriers: CSW will  continue to follow up for appropriate referrals and possible discharge planning  Reason for Continuation of Hospitalization: Medication  stabilization  Estimated Length of Stay: discharge today  Attendees: Patient: 03/09/2019   Physician: Dr. Neita Garnet, MD Dr.Farah 03/09/2019   Nursing: Nicoletta Dress, RN 03/09/2019   RN Care Manager: 03/09/2019  Social Worker: Ardelle Anton, [place] Misako Roeder, [place] 03/09/2019   Recreational Therapist:  03/09/2019  Other:  03/09/2019  Other:  03/09/2019   Other: 03/09/2019     Scribe for Treatment Team: Joellen Jersey, [place] 03/09/2019 10:40 AM

## 2019-03-09 NOTE — Progress Notes (Signed)
Recreation Therapy Notes  Date: 12.2.20 Time: 1000 Location: [address] Dayroom  Group Topic: Communication  Goal Area(s) Addresses:  Patient will effectively communicate with peers in group.  Patient will verbalize benefit of healthy communication. Patient will verbalize positive effect of healthy communication on post d/c goals.  Patient will identify communication techniques that made activity effective for group.   Behavioral Response: Engaged  Intervention: Drawings, Pencils, Blank Paper   Activity: Geometrical Drawings.  One person comes up a describes a geometrical drawing to the rest of the group.  The presenter will get as detailed as needed to describe the picture.  The remaining members of the group can only ask the presenter to repeat themselves.  They can not ask any specific questions.  When the presenter is finished, the group will compare their pictures to the original.  Education: Communication, Discharge Planning  Education Outcome: Acknowledges understanding/In group clarification offered/Needs additional education.   Clinical Observations/Feedback:  Pt was active.  Pt had a hard time getting started with describing the picture.  LRT helped pt by describing the first two shapes for him so he could understand what to do.  Once pt started, he struggled with giving clear cut instructions.  Pt felt he could have described the picture better.  Pt also expressed the tone in which you speak to people can affect communication.  Pt also explained texting doesn't give proper context and can lead to misunderstandings.     Victorino Sparrow, LRT/CTRS     Victorino Sparrow A 03/09/2019 10:48 AM

## 2019-03-09 NOTE — Plan of Care (Signed)
Pt attended and engaged in recreation therapy group sessions without prompting from LRT.   Victorino Sparrow, LRT/CTRS

## 2019-03-09 NOTE — BHH Suicide Risk Assessment (Signed)
[facility] Discharge Suicide Risk Assessment   Principal Problem: Schizoaffective disorder, bipolar type [facility]) Discharge Diagnoses: Principal Problem:   Schizoaffective disorder, bipolar type ([place])   Total Time spent with patient: 45 minutes  Musculoskeletal: Strength & Muscle Tone: within normal limits Gait & Station: normal Psychiatric Specialty Exam: ROS  Blood pressure 123/86, pulse (!) 107, temperature 97.7 F (36.5 C), temperature source Oral, resp. rate 18, height 5\' 5"  (1.651 m), weight 49.9 kg, SpO2 100 %.Body mass index is 18.3 kg/m.  General Appearance: Casual  Eye Contact::  Good  Speech:  Clear and Coherent409  Volume:  Normal  Mood:  Euthymic  Affect:  Restricted  Thought Process:  Coherent, Linear and Descriptions of Associations: Circumstantial  Orientation:  Full (Time, Place, and Person)  Thought Content:  Tangential  Suicidal Thoughts:  No  Homicidal Thoughts:  No  Memory:  Immediate;   Fair Recent;   Fair Remote;   Fair  Judgement:  Fair  Insight:  Fair  Psychomotor Activity:  Normal  Concentration:  Good  Recall:  [employer] of Knowledge:Poor  Language: Good  Akathisia:  Negative  Handed:  Right  AIMS (if indicated):     Assets:  Communication Skills Desire for Improvement  Sleep:  Number of Hours: 7.75  Cognition: WNL  ADL's:  Intact   Mental Status Per Nursing Assessment::   On Admission:  Suicidal ideation indicated by others(Mother reports that he Pt stated that he wanted to die to be with his grancdather.)  Demographic Factors:  Male  Loss Factors: Decrease in vocational status  Historical Factors: NA  Risk Reduction Factors:   Positive social support and Positive therapeutic relationship  Continued Clinical Symptoms:  Previous Psychiatric Diagnoses and Treatments  Cognitive Features That Contribute To Risk:  Polarized thinking    Suicide Risk:  Minimal: No identifiable suicidal ideation.  Patients presenting with no risk  factors but with morbid ruminations; may be classified as minimal risk based on the severity of the depressive symptoms  Follow-up Information    [facility]. Go on 03/09/2019.   Why: Your hospital discharge appointment is scheduled for 12/02 at 1:15 with [facility]. The appointment will be held in person, wear a mask! Bring your hospital discharge paperwork, photo ID, current medications, and proof of income or insurance.  Contact information: [address] [place] [address] [phone]           Plan Of Care/Follow-up recommendations:  Activity:  full  Dilia Alemany, MD 03/09/2019, 10:12 AM

## 2019-03-09 NOTE — Progress Notes (Signed)
Adult Psychoeducational Group Note  Date:  03/09/2019 Time:  2:08 AM  Group Topic/Focus:  Wrap-Up Group:   The focus of this group is to help patients review their daily goal of treatment and discuss progress on daily workbooks.  Participation Level:  Active  Participation Quality:  Appropriate  Affect:  Appropriate and Excited  Cognitive:  Appropriate  Insight: Appropriate  Engagement in Group:  Developing/Improving  Modes of Intervention:  Discussion  Additional Comments: Pt stated his goal for today was to focus on his treatment planand try to keep a positive attitude towards his treatment .Pt stated he felt he accomplished his goal today. Pt stated his relationship with his family needs to be improved. Pt stated he felt better about himself today. Pt rated his overall day a 10. Pt stated his appetite was pretty goodtoday. Pt stated his goal for tonight was to get some rest. Pt stated he was no physical pain today. Pt stated he was not hearing or seeing anything that was not there today. Pt stated he had no thoughts of harming himself or others today. Pt state if anything change he would alert staff.  Candy Sledge 03/09/2019, 2:08 AM

## 2019-03-09 NOTE — Progress Notes (Signed)
Recreation Therapy Notes  INPATIENT RECREATION TR PLAN  Patient Details Name: Paul Brown MRN: [id number] DOB: 1990-06-08 Today's Date: 03/09/2019  Rec Therapy Plan Is patient appropriate for Therapeutic Recreation?: Yes Treatment times per week: about 3 days Estimated Length of Stay: 5-7 days TR Treatment/Interventions: Group participation (Comment)  Discharge Criteria Pt will be discharged from therapy if:: Discharged Treatment plan/goals/alternatives discussed and agreed upon by:: Patient/family  Discharge Summary Short term goals set: See patient care plan Short term goals met: Complete Progress toward goals comments: Groups attended Which groups?: Coping skills, Communication Reason goals not met: None Therapeutic equipment acquired: N/A Reason patient discharged from therapy: Discharge from hospital Pt/family agrees with progress & goals achieved: Yes Date patient discharged from therapy: 03/09/19    Victorino Sparrow, LRT/CTRS  Ria Comment, Chigozie Basaldua A 03/09/2019, 11:05 AM

## 2019-03-16 ENCOUNTER — Encounter (HOSPITAL_COMMUNITY): Payer: Self-pay | Admitting: Emergency Medicine

## 2019-03-16 ENCOUNTER — Other Ambulatory Visit: Payer: Self-pay

## 2019-03-16 ENCOUNTER — Inpatient Hospital Stay (HOSPITAL_COMMUNITY)
Admission: AD | Admit: 2019-03-16 | Discharge: 2019-03-23 | DRG: 885 | Disposition: A | Discharge status: Home / Self Care | Payer: No Typology Code available for payment source | Attending: Psychiatry | Admitting: Psychiatry

## 2019-03-16 DIAGNOSIS — Z590 Homelessness: Secondary | ICD-10-CM

## 2019-03-16 DIAGNOSIS — F250 Schizoaffective disorder, bipolar type: Principal | ICD-10-CM | POA: Diagnosis present

## 2019-03-16 DIAGNOSIS — F419 Anxiety disorder, unspecified: Secondary | ICD-10-CM | POA: Diagnosis present

## 2019-03-16 DIAGNOSIS — Z560 Unemployment, unspecified: Secondary | ICD-10-CM

## 2019-03-16 DIAGNOSIS — F17210 Nicotine dependence, cigarettes, uncomplicated: Secondary | ICD-10-CM | POA: Diagnosis present

## 2019-03-16 DIAGNOSIS — F1220 Cannabis dependence, uncomplicated: Secondary | ICD-10-CM | POA: Diagnosis present

## 2019-03-16 DIAGNOSIS — Z79891 Long term (current) use of opiate analgesic: Secondary | ICD-10-CM

## 2019-03-16 DIAGNOSIS — Z20828 Contact with and (suspected) exposure to other viral communicable diseases: Secondary | ICD-10-CM | POA: Diagnosis present

## 2019-03-16 DIAGNOSIS — F319 Bipolar disorder, unspecified: Secondary | ICD-10-CM | POA: Diagnosis not present

## 2019-03-16 DIAGNOSIS — Z79899 Other long term (current) drug therapy: Secondary | ICD-10-CM

## 2019-03-16 DIAGNOSIS — F418 Other specified anxiety disorders: Secondary | ICD-10-CM | POA: Diagnosis present

## 2019-03-16 LAB — RESPIRATORY PANEL BY RT PCR (FLU A&B, COVID)
Influenza A by PCR: NEGATIVE
Influenza B by PCR: NEGATIVE
SARS Coronavirus 2 by RT PCR: NEGATIVE

## 2019-03-16 MED ORDER — TRAZODONE HCL 50 MG PO TABS
50.0000 mg | ORAL_TABLET | Freq: Every evening | ORAL | Status: DC | PRN
  Administered 2019-03-17 – 2019-03-22 (×6): 50 mg via ORAL
  Filled 2019-03-16 (×6): qty 1

## 2019-03-16 MED ORDER — MAGNESIUM HYDROXIDE 400 MG/5ML PO SUSP: 30.0000 mL | Freq: Every day | ORAL | Status: DC | PRN

## 2019-03-16 MED ORDER — HYDROXYZINE HCL 25 MG PO TABS
25.0000 mg | ORAL_TABLET | Freq: Three times a day (TID) | ORAL | Status: DC | PRN
  Administered 2019-03-17 – 2019-03-22 (×6): 25 mg via ORAL
  Filled 2019-03-16 (×7): qty 1

## 2019-03-16 MED ORDER — ACETAMINOPHEN 325 MG PO TABS: 650.0000 mg | ORAL_TABLET | Freq: Four times a day (QID) | ORAL | Status: DC | PRN

## 2019-03-16 MED ORDER — ALUM & MAG HYDROXIDE-SIMETH 200-200-20 MG/5ML PO SUSP: 30.0000 mL | ORAL | Status: DC | PRN
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
## 2019-03-16 NOTE — Progress Notes (Signed)
Patient ID: Paul Brown, male   DOB: [birth year], 28 y.o.   MRN: [id number] Pt A&O x 4, under IVC by mother , presents with complaint of not taking meds, threatening others, family members, damaging property.  Pt has been living in a hotel for the past week.  Denies SI, HI or AVH.   Pt reports his main stressor is his family.  Pt follows up with [facility]..  Skin search completed,  Monitoring for safety, no distress noted.
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
## 2019-03-16 NOTE — Plan of Care (Signed)
[place] Observation Crisis Plan  Reason for Crisis Plan:  Crisis Stabilization   Plan of Care:  Referral for Inpatient Hospitalization  Family Support:      Current Living Environment:  Living Arrangements: Other (Comment)(homeless, shelter)  Insurance:   Hospital Account    Name Acct ID Class Status Primary Coverage   Cinsere, Mizrahi [id number] [place] Open None        Guarantor Account (for Hospital Account [id number])    Name Relation to Pt Service Area Active? Acct Type   [name] Self [facility] Yes Behavioral Health   Address Phone       [address] [place], [place] [address] [phone])          Coverage Information (for Hospital Account [id number])    Not on file      Legal Guardian:  Legal Guardian: Mother  Primary Care Provider:  Patient, No Pcp Per  Current Outpatient Providers:  [facility]  Psychiatrist:  Name of Psychiatrist: Beverly Sessions   Counselor/Therapist:  Name of Therapist: Beverly Sessions Rodman Key)  Compliant with Medications:  No  Additional Information:   Jesusita Oka 12/9/20209:46 PM

## 2019-03-17 DIAGNOSIS — F250 Schizoaffective disorder, bipolar type: Secondary | ICD-10-CM | POA: Diagnosis present

## 2019-03-17 DIAGNOSIS — Z590 Homelessness: Secondary | ICD-10-CM | POA: Diagnosis not present

## 2019-03-17 DIAGNOSIS — Z560 Unemployment, unspecified: Secondary | ICD-10-CM | POA: Diagnosis not present

## 2019-03-17 DIAGNOSIS — Z20828 Contact with and (suspected) exposure to other viral communicable diseases: Secondary | ICD-10-CM | POA: Diagnosis present

## 2019-03-17 DIAGNOSIS — F418 Other specified anxiety disorders: Secondary | ICD-10-CM | POA: Diagnosis present

## 2019-03-17 DIAGNOSIS — Z79899 Other long term (current) drug therapy: Secondary | ICD-10-CM | POA: Diagnosis not present

## 2019-03-17 DIAGNOSIS — F419 Anxiety disorder, unspecified: Secondary | ICD-10-CM | POA: Diagnosis present

## 2019-03-17 DIAGNOSIS — F17210 Nicotine dependence, cigarettes, uncomplicated: Secondary | ICD-10-CM | POA: Diagnosis present

## 2019-03-17 DIAGNOSIS — Z79891 Long term (current) use of opiate analgesic: Secondary | ICD-10-CM | POA: Diagnosis not present

## 2019-03-17 DIAGNOSIS — F1220 Cannabis dependence, uncomplicated: Secondary | ICD-10-CM | POA: Diagnosis present

## 2019-03-17 LAB — CBC WITH DIFFERENTIAL/PLATELET
Abs Immature Granulocytes: 0.03 10*3/uL (ref 0.00–0.07)
Basophils Absolute: 0 10*3/uL (ref 0.0–0.1)
Basophils Relative: 0 %
Eosinophils Absolute: 0.1 10*3/uL (ref 0.0–0.5)
Eosinophils Relative: 1 %
HCT: 42.6 % (ref 39.0–52.0)
Hemoglobin: 13.8 g/dL (ref 13.0–17.0)
Immature Granulocytes: 0 %
Lymphocytes Relative: 33 %
Lymphs Abs: 2.6 10*3/uL (ref 0.7–4.0)
MCH: 32.3 pg (ref 26.0–34.0)
MCHC: 32.4 g/dL (ref 30.0–36.0)
MCV: 99.8 fL (ref 80.0–100.0)
Monocytes Absolute: 0.9 10*3/uL (ref 0.1–1.0)
Monocytes Relative: 11 %
Neutro Abs: 4.2 10*3/uL (ref 1.7–7.7)
Neutrophils Relative %: 55 %
Platelets: 246 10*3/uL (ref 150–400)
RBC: 4.27 MIL/uL (ref 4.22–5.81)
RDW: 13.6 % (ref 11.5–15.5)
WBC: 7.8 10*3/uL (ref 4.0–10.5)
nRBC: 0 % (ref 0.0–0.2)

## 2019-03-17 LAB — COMPREHENSIVE METABOLIC PANEL
ALT: 28 U/L (ref 0–44)
AST: 22 U/L (ref 15–41)
Albumin: 4.3 g/dL (ref 3.5–5.0)
Alkaline Phosphatase: 49 U/L (ref 38–126)
Anion gap: 13 (ref 5–15)
BUN: 12 mg/dL (ref 6–20)
CO2: 27 mmol/L (ref 22–32)
Calcium: 9.4 mg/dL (ref 8.9–10.3)
Chloride: 100 mmol/L (ref 98–111)
Creatinine, Ser: 0.9 mg/dL (ref 0.61–1.24)
GFR calc Af Amer: >60 mL/min (ref >60)
GFR calc non Af Amer: >60 mL/min (ref >60)
Glucose, Bld: 96 mg/dL (ref 70–99)
Potassium: 3.5 mmol/L (ref 3.5–5.1)
Sodium: 140 mmol/L (ref 135–145)
Total Bilirubin: 0.5 mg/dL (ref 0.3–1.2)
Total Protein: 7.4 g/dL (ref 6.5–8.1)

## 2019-03-17 LAB — RAPID URINE DRUG SCREEN, HOSP PERFORMED
Amphetamines: NOT DETECTED
Barbiturates: NOT DETECTED
Benzodiazepines: NOT DETECTED
Cocaine: NOT DETECTED
Opiates: NOT DETECTED
Tetrahydrocannabinol: POSITIVE — AB

## 2019-03-17 LAB — HEPATIC FUNCTION PANEL
ALT: 29 U/L (ref 0–44)
AST: 24 U/L (ref 15–41)
Albumin: 4.3 g/dL (ref 3.5–5.0)
Alkaline Phosphatase: 50 U/L (ref 38–126)
Bilirubin, Direct: 0.1 mg/dL (ref 0.0–0.2)
Indirect Bilirubin: 0.4 mg/dL (ref 0.3–0.9)
Total Bilirubin: 0.5 mg/dL (ref 0.3–1.2)
Total Protein: 7.5 g/dL (ref 6.5–8.1)

## 2019-03-17 LAB — LIPID PANEL
Cholesterol: 151 mg/dL (ref 0–200)
HDL: 56 mg/dL (ref >40)
LDL Cholesterol: 85 mg/dL (ref 0–99)
Total CHOL/HDL Ratio: 2.7 RATIO
Triglycerides: 52 mg/dL (ref <150)
VLDL: 10 mg/dL (ref 0–40)

## 2019-03-17 LAB — URINALYSIS, COMPLETE (UACMP) WITH MICROSCOPIC
Bacteria, UA: NONE SEEN
Bilirubin Urine: NEGATIVE
Glucose, UA: NEGATIVE mg/dL
Hgb urine dipstick: NEGATIVE
Ketones, ur: NEGATIVE mg/dL
Leukocytes,Ua: NEGATIVE
Nitrite: NEGATIVE
Protein, ur: 30 mg/dL — AB
Specific Gravity, Urine: 1.03 (ref 1.005–1.030)
pH: 6 (ref 5.0–8.0)

## 2019-03-17 LAB — GLUCOSE, CAPILLARY: Glucose-Capillary: 78 mg/dL (ref 70–99)

## 2019-03-17 LAB — MAGNESIUM: Magnesium: 2 mg/dL (ref 1.7–2.4)

## 2019-03-17 LAB — TSH: TSH: 2.538 u[IU]/mL (ref 0.350–4.500)

## 2019-03-17 LAB — ETHANOL: Alcohol, Ethyl (B): <10 mg/dL (ref <10)

## 2019-03-17 LAB — HEMOGLOBIN A1C
Hgb A1c MFr Bld: 6 % — ABNORMAL HIGH (ref 4.8–5.6)
Mean Plasma Glucose: 125.5 mg/dL

## 2019-03-17 LAB — CARBAMAZEPINE LEVEL, TOTAL: Carbamazepine Lvl: 2.7 ug/mL — ABNORMAL LOW (ref 4.0–12.0)

## 2019-03-17 MED ORDER — CARBAMAZEPINE 100 MG PO CHEW
100.0000 mg | CHEWABLE_TABLET | Freq: Two times a day (BID) | ORAL | Status: DC
  Administered 2019-03-17 – 2019-03-23 (×13): 100 mg via ORAL
  Filled 2019-03-17 (×18): qty 1

## 2019-03-17 MED ORDER — OLANZAPINE 5 MG PO TBDP
15.0000 mg | ORAL_TABLET | Freq: Every day | ORAL | Status: DC
  Administered 2019-03-17 – 2019-03-22 (×6): 15 mg via ORAL
  Filled 2019-03-17 (×4): qty 3
  Filled 2019-03-17: qty 21
  Filled 2019-03-17 (×7): qty 3

## 2019-03-17 MED ORDER — OLANZAPINE 5 MG PO TBDP
5.0000 mg | ORAL_TABLET | ORAL | Status: AC
  Administered 2019-03-17: 5 mg via ORAL
  Filled 2019-03-17: qty 1

## 2019-03-17 MED ORDER — TRAZODONE HCL 100 MG PO TABS: 100.0000 mg | ORAL_TABLET | Freq: Every evening | ORAL | Status: DC | PRN

## 2019-03-17 NOTE — Progress Notes (Signed)
   03/17/19 1000  Psych Admission Type (Psych Patients Only)  Admission Status Involuntary  Psychosocial Assessment  Patient Complaints Anger;Irritability  Eye Contact Fair  Facial Expression Animated  Affect Angry  Speech Logical/coherent  Interaction Assertive  Motor Activity Other (Comment) (wdl)  Appearance/Hygiene Unremarkable  Behavior Characteristics Cooperative;Anxious  Mood Angry;Irritable  Aggressive Behavior  Targets Property  Type of Behavior Rage  Effect No apparent injury  Thought Process  Coherency WDL  Content WDL  Delusions None reported or observed  Perception WDL  Hallucination None reported or observed  Judgment Limited  Confusion None  Danger to Self  Current suicidal ideation? Denies  Danger to Others  Danger to Others None reported or observed

## 2019-03-17 NOTE — Progress Notes (Signed)
Patient transferred to 505-2 as recommended by Dr Mallie Darting.

## 2019-03-17 NOTE — BH Assessment (Signed)
[place] Assessment Progress Note  Per Myles Lipps, MD, this pt requires psychiatric hospitalization.  Heather, RN has assigned pt to [facility] Rm 505-2.  Pt presents under IVC initiated by pt's mother, and upheld by Dr Mallie Darting.  Pt's nurse, Louanne Skye, has been notified.   Jalene Mullet, [place] Coordinator [phone]

## 2019-03-17 NOTE — Plan of Care (Signed)
[place] Observation Crisis Plan  Reason for Crisis Plan:  Chronic Mental Illness/Medical Illness   Plan of Care:  Referral for Inpatient Hospitalization  Family Support:    Family conflict per patient  Current Living Environment:  Living Arrangements: Alone  Insurance:   Hospital Account    Name Acct ID Class Status Primary Coverage   Paul Brown, Paul Brown [id number] [place] Open None        Guarantor Account (for Hospital Account [id number])    Name Relation to Pt Service Area Active? Acct Type   Paul Brown Self [facility] Yes Behavioral Health   Address Phone       [address] [place], [place] [address] [phone])          Coverage Information (for Hospital Account [id number])    Not on file      Legal Guardian:  Legal Guardian: Mother  Primary Care Provider:  Patient, No Pcp Per  Current Outpatient Providers:  [facility]  Psychiatrist:  Name of Psychiatrist: Beverly Brown   Counselor/Therapist:  Name of Therapist: Beverly Brown Paul Brown)  Compliant with Medications:  Yes  Additional Information:   Paul Brown 12/10/202010:35 AM
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
## 2019-03-17 NOTE — Progress Notes (Signed)
   03/17/19 2300  Psych Admission Type (Psych Patients Only)  Admission Status Involuntary  Psychosocial Assessment  Patient Complaints Anxiety  Eye Contact Fair  Facial Expression Animated  Affect Angry  Speech Logical/coherent  Interaction Assertive  Motor Activity Other (Comment) (wdl)  Appearance/Hygiene Unremarkable  Behavior Characteristics Cooperative  Mood Preoccupied;Suspicious  Aggressive Behavior  Targets Property  Type of Behavior Rage  Effect No apparent injury  Thought Process  Coherency WDL  Content WDL  Delusions None reported or observed  Perception WDL  Hallucination None reported or observed  Judgment Limited  Confusion None  Danger to Self  Current suicidal ideation? Denies  Danger to Others  Danger to Others None reported or observed   Pt visible on the unit this evening.

## 2019-03-18 DIAGNOSIS — F250 Schizoaffective disorder, bipolar type: Principal | ICD-10-CM

## 2019-03-18 LAB — PROLACTIN: Prolactin: 10 ng/mL (ref 4.0–15.2)

## 2019-03-18 NOTE — [facility] Suicide Risk Assessment (Signed)
[facility] Admission Suicide Risk Assessment   Nursing information obtained from:  Family, Review of record Demographic factors:  Male Current Mental Status:  NA Loss Factors:  Financial problems / change in socioeconomic status Historical Factors:  Impulsivity Risk Reduction Factors:  Sense of responsibility to family, Employed, Living with another person, especially a relative, Positive social support, Positive therapeutic relationship  Total Time spent with patient: 45 minutes Principal Problem: <principal problem not specified> Diagnosis:  Active Problems:   Schizoaffective disorder, bipolar type (HCC)  Subjective Data: Recurrence of psychosis substance abuse  Continued Clinical Symptoms:  Alcohol Use Disorder Identification Test Final Score (AUDIT): 0 The "Alcohol Use Disorders Identification Test", Guidelines for Use in Primary Care, Second Edition.  World Pharmacologist [facility]). Score between 0-7:  no or low risk or alcohol related problems. Score between 8-15:  moderate risk of alcohol related problems. Score between 16-19:  high risk of alcohol related problems. Score 20 or above:  warrants further diagnostic evaluation for alcohol dependence and treatment.   CLINICAL FACTORS:   Alcohol/Substance Abuse/Dependencies   Musculoskeletal: Strength & Muscle Tone: within normal limits Gait & Station: normal Patient leans: N/A  Psychiatric Specialty Exam: Physical Exam  Nursing note and vitals reviewed. Constitutional: He appears well-developed and well-nourished.  Cardiovascular: Normal rate and regular rhythm.    Review of Systems  Constitutional: Negative.   Eyes: Negative.   Neurological: Negative.   Hematological: Negative.     Blood pressure 132/77, pulse 93, temperature (!) 97.5 F (36.4 C), temperature source Oral, resp. rate 18, height 5\' 6"  (1.676 m), weight 50.3 kg, SpO2 100 %.Body mass index is 17.92 kg/m.  General Appearance: Casual  Eye Contact:  Absent   Speech:  Slow  Volume:  Decreased  Mood:  Dysphoric  Affect:  Blunt  Thought Process:  Disorganized and Irrelevant  Orientation:  Full (Time, Place, and Person)  Thought Content:  Illogical and Delusions  Suicidal Thoughts:  No  Homicidal Thoughts:  No  Memory:  Immediate;   Poor Recent;   Poor Remote;   Poor  Judgement:  Impaired  Insight:  Lacking  Psychomotor Activity:  Decreased  Concentration:  Concentration: Poor and Attention Span: Poor  Recall:  Poor  Fund of Knowledge:  Poor  Language:  Poor  Akathisia:  Negative  Handed:  Right  AIMS (if indicated):     Assets:  Leisure Time Physical Health  ADL's:  Intact  Cognition:  WNL  Sleep:  Number of Hours: 7.25   COGNITIVE FEATURES THAT CONTRIBUTE TO RISK:  Polarized thinking    SUICIDE RISK:   Minimal: No identifiable suicidal ideation.  Patients presenting with no risk factors but with morbid ruminations; may be classified as minimal risk based on the severity of the depressive symptoms  PLAN OF CARE: see eval  I certify that inpatient services furnished can reasonably be expected to improve the patient's condition.   Johnn Hai, MD 03/18/2019, 1:03 PM

## 2019-03-18 NOTE — Progress Notes (Signed)
Recreation Therapy Notes  Date: 12.11.20 Time: 1000 Location: 500 Hall Dayroom  Group Topic: Self-Esteem  Goal Area(s) Addresses:  Patient will successfully identify positive attributes about themselves.  Patient will successfully identify benefit of improved self-esteem.   Behavioral Response: Engaged  Intervention: Visual merchandiser, colored pencils, music  Activity: Personalized License Plate.  Patients were to create a personalized license plate that focused on the good things about themselves.  Patients could also highlight important dates, accomplishments or things hoped to accomplish.  Education:  Self-Esteem, Dentist.   Education Outcome: Acknowledges education/In group clarification offered/Needs additional education  Clinical Observations/Feedback:  Pt used his stage name Nancy Nordmann and the number 25 because "I want to be beside [place] and [place]".  Pt expressed he makes music, plays chess and used flowers as symbols of love.   Victorino Sparrow, LRT/CTRS     Victorino Sparrow A 03/18/2019 11:41 AM

## 2019-03-18 NOTE — H&P (Signed)
Psychiatric Admission Assessment Adult  Patient Identification: Paul Brown MRN:  [id number] Date of Evaluation:  03/18/2019 Chief Complaint:  bipolar Principal Diagnosis: Exacerbation of schizoaffective condition/recurrence of volatility/continuation of cannabis dependency  Diagnosis:  Active Problems:   Schizoaffective disorder, bipolar type (HCC)  History of Present Illness:  This is a repeat admission for this 28 year old patient with a known schizoaffective bipolar type condition, cannabis dependency, and despite recent stabilization here, in fact discharged on 12/2/once again required hospitalization he had been volatile at home.  He apparently kicked in a door was argumentative and volatile with his mother so forth and since he has been admitted he has been informed is not welcome back in the family home  When I interviewed the patient once again he just blames issues on "his mother" and states that "we just disagree" and speaks vaguely and will not elaborate as to whether he is having hallucinations/will not elaborate about his drug use/compliance so forth.  Discharge summary of 12/2 reads as follows Paul Brown was admitted under IVC from his family for reports of property destruction, smoking K2, and threatening to kill his mother. He denied reports on IVC paperwork throughout admission. [facility]. He was started on Tegretol, Zyprexa, Restoril, trazodone, and Vistaril. He participated in group therapy on the unit. He responded well to treatment with no adverse effects reported. He admitted to anger at his family for committing him but showed calm mood during admission. No agitated or disruptive behaviors on the unit. He denies any SI/HI/AVH and contracts for safety. He shows no signs of responding to internal stimuli. No paranoid or delusional thought content expressed. He is discharging on the medications listed below. [facility] Patient is provided with prescriptions for medications upon discharge. He is [place] via [place].   Associated Signs/Symptoms: Depression Symptoms:  anhedonia, (Hypo) Manic Symptoms:  Delusions, Anxiety Symptoms:  n/a Psychotic Symptoms:  Delusions, PTSD Symptoms: NA Total Time spent with patient: 45 minutes  Past Psychiatric History: Multiple similar presentations clearly volatility propelled by underlying psychotic disorder complicated by cannabis and synthetic cannabis abuse/dependency's  Is the patient at risk to self? Yes.    Has the patient been a risk to self in the past 6 months? Yes.    Has the patient been a risk to self within the distant past? No.  Is the patient a risk to others? Yes.    Has the patient been a risk to others in the past 6 months? Yes.    Has the patient been a risk to others within the distant past? No.   Prior Inpatient Therapy: Prior Inpatient Therapy: Yes Prior Therapy Dates: 2020 [facility] Reason for Treatment: inapti9ent Prior Outpatient Therapy: Prior Outpatient Therapy: Yes Prior Therapy Dates: 2016 to current Prior Therapy Facilty/Provider(s): [facility] Reason for Treatment: med management & therapy Does patient have an ACCT team?: No Does patient have Intensive In-House Services?  : No Does patient have [facility] services? : No Does patient have P4[facility] services?: No  Alcohol Screening: 1. How often do you have a drink containing alcohol?: Never 2. How many drinks containing alcohol do you have on a typical day when you are drinking?: 1 or 2 3. How often do you have six or more drinks on one occasion?: Never AUDIT-C Score: 0 4. How often during the last year have you found that you were not able to stop drinking once  you had started?: Never 5. How often during the last year have you failed to do what was normally expected from you becasue of drinking?: Never 6. How often during the last year  have you needed a first drink in the morning to get yourself going after a heavy drinking session?: Never 7. How often during the last year have you had a feeling of guilt of remorse after drinking?: Never 8. How often during the last year have you been unable to remember what happened the night before because you had been drinking?: Never 9. Have you or someone else been injured as a result of your drinking?: No 10. Has a relative or friend or a doctor or another health worker been concerned about your drinking or suggested you cut down?: No Alcohol Use Disorder Identification Test Final Score (AUDIT): 0 Alcohol Brief Interventions/Follow-up: AUDIT Score <7 follow-up not indicated Substance Abuse History in the last 12 months:  Yes.   Consequences of Substance Abuse: Medical Consequences:  Seems to be propelling his psychosis Previous Psychotropic Medications: Yes  Psychological Evaluations: No  Past Medical History:  Past Medical History:  Diagnosis Date  . Anxiety depression  . Bipolar 1 disorder ([place])   . Depression   . Mood disorder ([place]) anxiety   History reviewed. No pertinent surgical history. Family History: History reviewed. No pertinent family history. Family Psychiatric  History: see evals Tobacco Screening:   Social History:  Social History   Substance and Sexual Activity  Alcohol Use No     Social History   Substance and Sexual Activity  Drug Use No    Additional Social History: Marital status: Single    Pain Medications: see MAR Prescriptions: see MAR Over the Counter: see MAR                    Allergies:  No Known Allergies Lab Results:  Results for orders placed or performed during the hospital encounter of 03/16/19 (from the past 48 hour(s))  Respiratory Panel by RT PCR (Flu A&B, Covid) - Nasopharyngeal Swab     Status: None   Collection Time: 03/16/19  8:34 PM   Specimen: Nasopharyngeal Swab  Result Value Ref Range   SARS Coronavirus 2 by  RT PCR NEGATIVE NEGATIVE    Comment: (NOTE) SARS-CoV-2 target nucleic acids are NOT DETECTED. The SARS-CoV-2 RNA is generally detectable in upper respiratoy specimens during the acute phase of infection. The lowest concentration of SARS-CoV-2 viral copies this assay can detect is 131 copies/mL. A negative result does not preclude SARS-Cov-2 infection and should not be used as the sole basis for treatment or other patient management decisions. A negative result may occur with  improper specimen collection/handling, submission of specimen other than nasopharyngeal swab, presence of viral mutation(s) within the areas targeted by this assay, and inadequate number of viral copies (<131 copies/mL). A negative result must be combined with clinical observations, patient history, and epidemiological information. The expected result is Negative. Fact Sheet for Patients:  PinkCheek.be Fact Sheet for Healthcare Providers:  GravelBags.it This test is not yet ap proved or cleared by the [place] FDA and  has been authorized for detection and/or diagnosis of SARS-CoV-2 by FDA under an Emergency Use Authorization (EUA). This EUA will remain  in effect (meaning this test can be used) for the duration of the COVID-19 declaration under Section 564(b)(1) of the Act, 21 U.S.C. section 360bbb-3(b)(1), unless the authorization is terminated or revoked sooner.    Influenza A  by PCR NEGATIVE NEGATIVE   Influenza B by PCR NEGATIVE NEGATIVE    Comment: (NOTE) The Xpert Xpress SARS-CoV-2/FLU/RSV assay is intended as an aid in  the diagnosis of influenza from Nasopharyngeal swab specimens and  should not be used as a sole basis for treatment. Nasal washings and  aspirates are unacceptable for Xpert Xpress SARS-CoV-2/FLU/RSV  testing. Fact Sheet for Patients: https://www.moore.com/ Fact Sheet for Healthcare  Providers: https://www.young.biz/ This test is not yet approved or cleared by the [place] FDA and  has been authorized for detection and/or diagnosis of SARS-CoV-2 by  FDA under an Emergency Use Authorization (EUA). This EUA will remain  in effect (meaning this test can be used) for the duration of the  Covid-19 declaration under Section 564(b)(1) of the Act, 21  U.S.C. section 360bbb-3(b)(1), unless the authorization is  terminated or revoked. Performed at [facility], [address] [place], [place] [address]   Glucose, capillary     Status: None   Collection Time: 03/17/19  6:10 AM  Result Value Ref Range   Glucose-Capillary 78 70 - 99 mg/dL  Comprehensive metabolic panel     Status: None   Collection Time: 03/17/19  6:34 AM  Result Value Ref Range   Sodium 140 135 - 145 mmol/L   Potassium 3.5 3.5 - 5.1 mmol/L   Chloride 100 98 - 111 mmol/L   CO2 27 22 - 32 mmol/L   Glucose, Bld 96 70 - 99 mg/dL   BUN 12 6 - 20 mg/dL   Creatinine, Ser 6.04 0.61 - 1.24 mg/dL   Calcium 9.4 8.9 - 54.0 mg/dL   Total Protein 7.4 6.5 - 8.1 g/dL   Albumin 4.3 3.5 - 5.0 g/dL   AST 22 15 - 41 U/L   ALT 28 0 - 44 U/L   Alkaline Phosphatase 49 38 - 126 U/L   Total Bilirubin 0.5 0.3 - 1.2 mg/dL   GFR calc non Af Amer >60 >60 mL/min   GFR calc Af Amer >60 >60 mL/min   Anion gap 13 5 - 15    Comment: Performed at [facility], [address], [place], [place] [address]  Hemoglobin A1c     Status: Abnormal   Collection Time: 03/17/19  6:34 AM  Result Value Ref Range   Hgb A1c MFr Bld 6.0 (H) 4.8 - 5.6 %    Comment: (NOTE) Pre diabetes:          5.7%-6.4% Diabetes:              >6.4% Glycemic control for   <7.0% adults with diabetes    Mean Plasma Glucose 125.5 mg/dL    Comment: Performed at [facility], [address], [place], [place] [address]  Magnesium     Status: None   Collection Time: 03/17/19  6:34 AM  Result Value  Ref Range   Magnesium 2.0 1.7 - 2.4 mg/dL    Comment: Performed at [facility], [address], [place], [place] [address]  Ethanol     Status: None   Collection Time: 03/17/19  6:34 AM  Result Value Ref Range   Alcohol, Ethyl (B) <10 <10 mg/dL    Comment: (NOTE) Lowest detectable limit for serum alcohol is 10 mg/dL. For medical purposes only. Performed at [facility], [address], [place], [place] [address]   Lipid panel     Status: None   Collection Time: 03/17/19  6:34 AM  Result Value Ref Range   Cholesterol 151 0 - 200 mg/dL   Triglycerides 52 <161 mg/dL   HDL 56 >09 mg/dL   Total CHOL/HDL Ratio 2.7 RATIO   VLDL 10 0 - 40 mg/dL   LDL Cholesterol 85 0 - 99 mg/dL    Comment:        Total Cholesterol/HDL:CHD Risk Coronary Heart Disease Risk Table                     Men   Women  1/2 Average Risk   3.4   3.3  Average Risk       5.0   4.4  2 X Average Risk   9.6   7.1  3 X Average Risk  23.4   11.0        Use the calculated Patient Ratio above and the CHD Risk Table to determine the patient's CHD Risk.        ATP III CLASSIFICATION (LDL):  <100     mg/dL   Optimal  604-540  mg/dL   Near or Above                    Optimal  130-159  mg/dL   Borderline  981-191  mg/dL   High  >478     mg/dL   Very High Performed at [facility], [address] [place], [place] [address]   Hepatic function panel     Status: None   Collection Time: 03/17/19  6:34 AM  Result Value Ref Range   Total Protein 7.5 6.5 - 8.1 g/dL   Albumin 4.3 3.5 - 5.0 g/dL   AST 24 15 - 41 U/L   ALT 29 0 - 44 U/L   Alkaline Phosphatase 50 38 - 126 U/L   Total Bilirubin 0.5 0.3 - 1.2 mg/dL   Bilirubin, Direct 0.1 0.0 - 0.2 mg/dL   Indirect Bilirubin 0.4 0.3 - 0.9 mg/dL    Comment: Performed at [facility], [address], [place], [place] [address]  TSH     Status: None   Collection Time: 03/17/19  6:34 AM  Result Value  Ref Range   TSH 2.538 0.350 - 4.500 uIU/mL    Comment: Performed by a 3rd Generation assay with a functional sensitivity of <=0.01 uIU/mL. Performed at [facility], [address], [place], [place] [address]   Prolactin     Status: None   Collection Time: 03/17/19  6:34 AM  Result Value Ref Range   Prolactin 10.0 4.0 - 15.2 ng/mL    Comment: (NOTE) Performed At: [facility] [address] [place], [place] [id number] Jolene Schimke MD LK:[id number]   Rapid urine drug screen (hospital performed)     Status: Abnormal   Collection Time: 03/17/19  8:09 AM  Result Value Ref Range   Opiates NONE DETECTED NONE DETECTED   Cocaine NONE DETECTED NONE DETECTED   Benzodiazepines NONE DETECTED NONE DETECTED   Amphetamines NONE DETECTED NONE DETECTED   Tetrahydrocannabinol POSITIVE (A) NONE DETECTED   Barbiturates NONE DETECTED NONE DETECTED    Comment: (NOTE) DRUG SCREEN FOR MEDICAL PURPOSES ONLY.  IF CONFIRMATION IS NEEDED FOR ANY PURPOSE, NOTIFY LAB WITHIN 5 DAYS. LOWEST DETECTABLE LIMITS FOR URINE DRUG SCREEN Drug Class                     Cutoff (ng/mL) Amphetamine and metabolites    1000 Barbiturate and  metabolites    200 Benzodiazepine                 200 Tricyclics and metabolites     300 Opiates and metabolites        300 Cocaine and metabolites        300 THC                            50 Performed at [facility], [address], [place], [place] [address]   Urinalysis, Complete w Microscopic     Status: Abnormal   Collection Time: 03/17/19  1:00 PM  Result Value Ref Range   Color, Urine YELLOW YELLOW   APPearance HAZY (A) CLEAR   Specific Gravity, Urine 1.030 1.005 - 1.030   pH 6.0 5.0 - 8.0   Glucose, UA NEGATIVE NEGATIVE mg/dL   Hgb urine dipstick NEGATIVE NEGATIVE   Bilirubin Urine NEGATIVE NEGATIVE   Ketones, ur NEGATIVE NEGATIVE mg/dL   Protein, ur 30 (A) NEGATIVE mg/dL   Nitrite NEGATIVE NEGATIVE   Leukocytes,Ua  NEGATIVE NEGATIVE   RBC / HPF 0-5 0 - 5 RBC/hpf   WBC, UA 0-5 0 - 5 WBC/hpf   Bacteria, UA NONE SEEN NONE SEEN   Mucus PRESENT    Uric Acid Crys, UA PRESENT     Comment: Performed at [facility], [address], [place], [place] [address]  Carbamazepine level, total     Status: Abnormal   Collection Time: 03/17/19  6:24 PM  Result Value Ref Range   Carbamazepine Lvl 2.7 (L) 4.0 - 12.0 ug/mL    Comment: Performed at [facility], [address] [place], [place] [address]  CBC with Differential/Platelet     Status: None   Collection Time: 03/17/19  6:24 PM  Result Value Ref Range   WBC 7.8 4.0 - 10.5 K/uL   RBC 4.27 4.22 - 5.81 MIL/uL   Hemoglobin 13.8 13.0 - 17.0 g/dL   HCT 34.1 93.7 - 90.2 %   MCV 99.8 80.0 - 100.0 fL   MCH 32.3 26.0 - 34.0 pg   MCHC 32.4 30.0 - 36.0 g/dL   RDW 40.9 73.5 - 32.9 %   Platelets 246 150 - 400 K/uL   nRBC 0.0 0.0 - 0.2 %   Neutrophils Relative % 55 %   Neutro Abs 4.2 1.7 - 7.7 K/uL   Lymphocytes Relative 33 %   Lymphs Abs 2.6 0.7 - 4.0 K/uL   Monocytes Relative 11 %   Monocytes Absolute 0.9 0.1 - 1.0 K/uL   Eosinophils Relative 1 %   Eosinophils Absolute 0.1 0.0 - 0.5 K/uL   Basophils Relative 0 %   Basophils Absolute 0.0 0.0 - 0.1 K/uL   Immature Granulocytes 0 %   Abs Immature Granulocytes 0.03 0.00 - 0.07 K/uL    Comment: Performed at [facility], [address] [place], [place] [address]    Blood Alcohol level:  Lab Results  Component Value Date   [facility] <10 03/17/2019   ETH <10 03/03/2019    Metabolic Disorder Labs:  Lab Results  Component Value Date   HGBA1C 6.0 (H) 03/17/2019   MPG 125.5 03/17/2019   MPG 119.76 03/03/2019   Lab Results  Component Value Date   PROLACTIN 10.0 03/17/2019   PROLACTIN 6.0 03/03/2019   Lab Results  Component Value Date   CHOL 151 03/17/2019   TRIG 52  03/17/2019   HDL 56 03/17/2019   CHOLHDL 2.7 03/17/2019   VLDL 10 03/17/2019   LDLCALC 85  03/17/2019   LDLCALC 65 03/03/2019    Current Medications: Current Facility-Administered Medications  Medication Dose Route Frequency Provider Last Rate Last Admin  . acetaminophen (TYLENOL) tablet 650 mg  650 mg Oral Q6H PRN Anike, Adaku C, NP      . alum & mag hydroxide-simeth (MAALOX/MYLANTA) 200-200-20 MG/5ML suspension 30 mL  30 mL Oral Q4H PRN Anike, Adaku C, NP      . carbamazepine (TEGRETOL) chewable tablet 100 mg  100 mg Oral BID Antonieta Pert, MD   100 mg at 03/18/19 0819  . hydrOXYzine (ATARAX/VISTARIL) tablet 25 mg  25 mg Oral TID PRN Anike, Adaku C, NP   25 mg at 03/17/19 2101  . magnesium hydroxide (MILK OF MAGNESIA) suspension 30 mL  30 mL Oral Daily PRN Anike, Adaku C, NP      . OLANZapine zydis (ZYPREXA) disintegrating tablet 15 mg  15 mg Oral QHS Antonieta Pert, MD   15 mg at 03/17/19 2101  . traZODone (DESYREL) tablet 100 mg  100 mg Oral QHS PRN Antonieta Pert, MD      . traZODone (DESYREL) tablet 50 mg  50 mg Oral QHS PRN Anike, Adaku C, NP   50 mg at 03/17/19 2101   PTA Medications: Medications Prior to Admission  Medication Sig Dispense Refill Last Dose  . carbamazepine (TEGRETOL) 100 MG chewable tablet Chew 1 tablet (100 mg total) by mouth 2 (two) times daily. For mood sabilization 60 tablet 0 Unknown at Unknown time  . hydrOXYzine (ATARAX/VISTARIL) 25 MG tablet Take 1 tablet (25 mg total) by mouth 3 (three) times daily as needed for anxiety. 60 tablet 0 Unknown at Unknown time  . OLANZapine (ZYPREXA) 15 MG tablet Take 1 tablet (15 mg total) by mouth at bedtime. For mood control 30 tablet 0 Unknown at Unknown time  . temazepam (RESTORIL) 15 MG capsule Take 1 capsule (15 mg total) by mouth at bedtime. For sleep 30 capsule 0 Unknown at Unknown time  . traZODone (DESYREL) 100 MG tablet Take 1 tablet (100 mg total) by mouth at bedtime as needed for sleep. 30 tablet 0 Unknown at Unknown time    Musculoskeletal: Strength & Muscle Tone: within normal  limits Gait & Station: normal Patient leans: N/A  Psychiatric Specialty Exam: Physical Exam  Nursing note and vitals reviewed. Constitutional: He appears well-developed and well-nourished.  Cardiovascular: Normal rate and regular rhythm.    Review of Systems  Constitutional: Negative.   Eyes: Negative.   Neurological: Negative.   Hematological: Negative.     Blood pressure 132/77, pulse 93, temperature (!) 97.5 F (36.4 C), temperature source Oral, resp. rate 18, height  (1.676 m), weight 50.3 kg, SpO2 100 %.Body mass index is 17.92 kg/m.  General Appearance: Casual  Eye Contact:  Absent  Speech:  Slow  Volume:  Decreased  Mood:  Dysphoric  Affect:  Blunt  Thought Process:  Disorganized and Irrelevant  Orientation:  Full (Time, Place, and Person)  Thought Content:  Illogical and Delusions  Suicidal Thoughts:  No  Homicidal Thoughts:  No  Memory:  Immediate;   Poor Recent;   Poor Remote;   Poor  Judgement:  Impaired  Insight:  Lacking  Psychomotor Activity:  Decreased  Concentration:  Concentration: Poor and Attention Span: Poor  Recall:  Poor  Fund of Knowledge:  Poor  Language:  Poor  Akathisia:  Negative  Handed:  Right  AIMS (if indicated):     Assets:  Leisure Time Physical Health  ADL's:  Intact  Cognition:  WNL  Sleep:  Number of Hours: 7.25    Treatment Plan Summary: Daily contact with patient to assess and evaluate symptoms and progress in treatment and Medication management  Observation Level/Precautions:  15 minute checks  Laboratory:  UDS  Psychotherapy: Reality based  Medications: Resume mood stabilizer and antipsychotic therapy  Consultations: Not necessary  Discharge Concerns: Compliance/drug abstinence/housing  Estimated LOS: 7-10  Other: Act team level of care would be necessary to keep him out of the hospital   Physician Treatment Plan for Primary Diagnosis: For psychotic disorder resume Zyprexa Tegretol will need long-acting  injectable will need probable housing probably needs an act team as well Long Term Goal(s): Improvement in symptoms so as ready for discharge  Short Term Goals: Stabilization of psychosis Long Term Goal(s): Ability to verbalize feelings will improve and Ability to disclose and discuss suicidal ideas  Short Term Goals: Ability to demonstrate self-control will improve, Ability to identify and develop effective coping behaviors will improve and Compliance with prescribed medications will improve  I certify that inpatient services furnished can reasonably be expected to improve the patient's condition.    Malvin Johns, MD 12/11/202012:56 PM
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
## 2019-03-18 NOTE — Tx Team (Signed)
Interdisciplinary Treatment and Diagnostic Plan Update  03/18/2019 Time of Session: 10:00am Paul Brown MRN: [id number]  Principal Diagnosis: <principal problem not specified>  Secondary Diagnoses: Active Problems:   Schizoaffective disorder, bipolar type ([place])   Current Medications:  Current Facility-Administered Medications  Medication Dose Route Frequency Provider Last Rate Last Admin  . acetaminophen (TYLENOL) tablet 650 mg  650 mg Oral Q6H PRN Anike, Adaku C, NP      . alum & mag hydroxide-simeth (MAALOX/MYLANTA) 200-200-20 MG/5ML suspension 30 mL  30 mL Oral Q4H PRN Anike, Adaku C, NP      . carbamazepine (TEGRETOL) chewable tablet 100 mg  100 mg Oral BID Sharma Covert, MD   100 mg at 03/18/19 0819  . hydrOXYzine (ATARAX/VISTARIL) tablet 25 mg  25 mg Oral TID PRN Anike, Adaku C, NP   25 mg at 03/17/19 2101  . magnesium hydroxide (MILK OF MAGNESIA) suspension 30 mL  30 mL Oral Daily PRN Anike, Adaku C, NP      . OLANZapine zydis (ZYPREXA) disintegrating tablet 15 mg  15 mg Oral QHS Sharma Covert, MD   15 mg at 03/17/19 2101  . traZODone (DESYREL) tablet 100 mg  100 mg Oral QHS PRN Sharma Covert, MD      . traZODone (DESYREL) tablet 50 mg  50 mg Oral QHS PRN Anike, Adaku C, NP   50 mg at 03/17/19 2101   PTA Medications: Medications Prior to Admission  Medication Sig Dispense Refill Last Dose  . carbamazepine (TEGRETOL) 100 MG chewable tablet Chew 1 tablet (100 mg total) by mouth 2 (two) times daily. For mood sabilization 60 tablet 0 Unknown at Unknown time  . hydrOXYzine (ATARAX/VISTARIL) 25 MG tablet Take 1 tablet (25 mg total) by mouth 3 (three) times daily as needed for anxiety. 60 tablet 0 Unknown at Unknown time  . OLANZapine (ZYPREXA) 15 MG tablet Take 1 tablet (15 mg total) by mouth at bedtime. For mood control 30 tablet 0 Unknown at Unknown time  . temazepam (RESTORIL) 15 MG capsule Take 1 capsule (15 mg total) by mouth at bedtime. For sleep 30 capsule 0  Unknown at Unknown time  . traZODone (DESYREL) 100 MG tablet Take 1 tablet (100 mg total) by mouth at bedtime as needed for sleep. 30 tablet 0 Unknown at Unknown time    Patient Stressors:    Patient Strengths:    Treatment Modalities: Medication Management, Group therapy, Case management,  1 to 1 session with clinician, Psychoeducation, Recreational therapy.   Physician Treatment Plan for Primary Diagnosis: <principal problem not specified> Long Term Goal(s):     Short Term Goals:    Medication Management: Evaluate patient's response, side effects, and tolerance of medication regimen.  Therapeutic Interventions: 1 to 1 sessions, Unit Group sessions and Medication administration.  Evaluation of Outcomes: Not Met  Physician Treatment Plan for Secondary Diagnosis: Active Problems:   Schizoaffective disorder, bipolar type ([place])  Long Term Goal(s):     Short Term Goals:       Medication Management: Evaluate patient's response, side effects, and tolerance of medication regimen.  Therapeutic Interventions: 1 to 1 sessions, Unit Group sessions and Medication administration.  Evaluation of Outcomes: Not Met   RN Treatment Plan for Primary Diagnosis: <principal problem not specified> Long Term Goal(s): Knowledge of disease and therapeutic regimen to maintain health will improve  Short Term Goals: Ability to verbalize frustration and anger appropriately will improve, Ability to participate in decision making will improve, Ability to  verbalize feelings will improve, Ability to identify and develop effective coping behaviors will improve and Compliance with prescribed medications will improve  Medication Management: RN will administer medications as ordered by provider, will assess and evaluate patient's response and provide education to patient for prescribed medication. RN will report any adverse and/or side effects to prescribing provider.  Therapeutic Interventions: 1 on 1  counseling sessions, Psychoeducation, Medication administration, Evaluate responses to treatment, Monitor vital signs and CBGs as ordered, Perform/monitor CIWA, COWS, AIMS and Fall Risk screenings as ordered, Perform wound care treatments as ordered.  Evaluation of Outcomes: Not Met   LCSW Treatment Plan for Primary Diagnosis: <principal problem not specified> Long Term Goal(s): Safe transition to appropriate next level of care at discharge, Engage patient in therapeutic group addressing interpersonal concerns.  Short Term Goals: Engage patient in aftercare planning with referrals and resources, Increase emotional regulation, Identify triggers associated with mental health/substance abuse issues and Increase skills for wellness and recovery  Therapeutic Interventions: Assess for all discharge needs, 1 to 1 time with Social worker, Explore available resources and support systems, Assess for adequacy in community support network, Educate family and significant other(s) on suicide prevention, Complete Psychosocial Assessment, Interpersonal group therapy.  Evaluation of Outcomes: Not Met  Progress in Treatment: Attending groups: No. New to unit. Participating in groups: No. Taking medication as prescribed: Yes. Toleration medication: Yes. Family/Significant other contact made: No, will contact:  supports if consents are granted. Patient understands diagnosis: No. Discussing patient identified problems/goals with staff: Yes. Medical problems stabilized or resolved: Yes. Denies suicidal/homicidal ideation: Yes. Issues/concerns per patient self-inventory: Yes.  New problem(s) identified: Yes, Describe:  family conflict, legal issues, financial stressors. Recently discharged, per IVC not medication compliant and threatened family.  New Short Term/Long Term Goal(s): medication management for mood stabilization; elimination of SI thoughts; development of comprehensive mental wellness/sobriety  plan.  Patient Goals:  Patient in shower, did not attend treatment team to state goal.  Discharge Plan or Barriers: Patient newly re-admitted, CSW assessing for appropriate referrals.  Reason for Continuation of Hospitalization: Depression Hallucinations Medication stabilization  Estimated Length of Stay: 5-7 days  Attendees: Patient: 03/18/2019 10:23 AM  Physician: Dr.Farah 03/18/2019 10:23 AM  Nursing: Benjamine Mola, RN 03/18/2019 10:23 AM  RN Care Manager: 03/18/2019 10:23 AM  Social Worker: Stephanie Acre, [place] 03/18/2019 10:23 AM  Recreational Therapist:  03/18/2019 10:23 AM  Other:  03/18/2019 10:23 AM  Other:  03/18/2019 10:23 AM  Other: 03/18/2019 10:23 AM    Scribe for Treatment Team: Joellen Jersey, [place] 03/18/2019 10:23 AM

## 2019-03-18 NOTE — Progress Notes (Signed)
The patient rated his day as a 10 out of a possible 10 . He states that he worked on trying to "move on" today. His goal for tomorrow is to work on his personal life. He acknowledges that their are problems living at home with his parents and would like to move out.

## 2019-03-18 NOTE — Progress Notes (Addendum)
Recreation Therapy Notes  Patient admitted to unit 12.10.20. Due to admission within last year, no new assessment conducted at this time. Last assessment conducted 11.27.20. Patient reports no changes in stressors from previous admission.  Patient stated reason for admission kicking in his aunt's door.   Patient denies SI, HI, AVH at this time. Patient reports goal of spreading positivity.   Information found below from assessment conducted 11.27.20  Coping Skills: Isolation, Sports, Deep Breathing, Meditate, Arguments, Avoidance, Exercise, Music, Substance Abuse, Prayer, Read, Hot Bath/Shower  Patient Strengths: Knowledge, Wisdom, Like to understand other's point of view  Areas of Improvement: Health, Getting more sleep    Paul Brown, LRT/CTRS   Victorino Sparrow A 03/18/2019 12:53 PM

## 2019-03-18 NOTE — [facility] Suicide Risk Assessment (Signed)
[facility] INPATIENT:  Family/Significant Other Suicide Prevention Education  Suicide Prevention Education:  Patient Refusal for Family/Significant Other Suicide Prevention Education: The patient Paul Brown has refused to provide written consent for family/significant other to be provided Family/Significant Other Suicide Prevention Education during admission and/or prior to discharge.  Physician notified.  Paul Brown 03/18/2019, 11:46 AM

## 2019-03-18 NOTE — BHH Counselor (Signed)
CSW met with patient at bedside to discuss discharge planning and review shelter resources. Patient is unable to return to his mother's home and reports he no longer has money for a hotel (where he discharged after his last admission).   CSW reviewed shelter resources with patient, patient was highly encouraged to call [facility] this afternoon. Patient verbalized understanding.  Stephanie Acre, MSW, [place] Social Worker [facility]  [phone]

## 2019-03-18 NOTE — B[facility] Counselor (Signed)
Patient's mother dropped off a note at the reception desk, which was delivered to patient. The note states that this patient is not allowed to return to the family home and that his cat was placed for adoption. Patient appeared sad to read this news, but reacted appropriately.  Stephanie Acre, MSW, [place] Social Worker [facility]  [phone]

## 2019-03-19 NOTE — Plan of Care (Signed)
  Problem: Activity: Goal: Interest or engagement in activities will improve Outcome: Progressing   Problem: Coping: Goal: Ability to verbalize frustrations and anger appropriately will improve Outcome: Progressing Goal: Ability to demonstrate self-control will improve Outcome: Progressing   

## 2019-03-19 NOTE — Progress Notes (Signed)
Patient ID: Maxime E Gedeon, male   DOB: 05/04/1990, 28 y.o.   MRN: [id number]   [place] NOVEL CORONAVIRUS (COVID-19) DAILY CHECK-OFF SYMPTOMS - answer yes or no to each - every day NO YES  Have you had a fever in the past 24 hours?  . Fever (Temp > 37.80C / 100F) X   Have you had any of these symptoms in the past 24 hours? . New Cough .  Sore Throat  .  Shortness of Breath .  Difficulty Breathing .  Unexplained Body Aches   X   Have you had any one of these symptoms in the past 24 hours not related to allergies?   . Runny Nose .  Nasal Congestion .  Sneezing   X   If you have had runny nose, nasal congestion, sneezing in the past 24 hours, has it worsened?  X   EXPOSURES - check yes or no X   Have you traveled outside the state in the past 14 days?  X   Have you been in contact with someone with a confirmed diagnosis of COVID-19 or PUI in the past 14 days without wearing appropriate PPE?  X   Have you been living in the same home as a person with confirmed diagnosis of COVID-19 or a PUI (household contact)?    X   Have you been diagnosed with COVID-19?    X              What to do next: Answered NO to all: Answered YES to anything:   Proceed with unit schedule Follow the [facility] Inpatient Flowsheet.   

## 2019-03-19 NOTE — Progress Notes (Signed)
[place] Group Notes:  (Nursing/MHT/Case Management/Adjunct)  Date:  03/19/2019  Time:  0900  Type of Therapy:  Nurse Education Art Painting Group  Participation Level:  Active  Participation Quality:  Appropriate  Affect:  Appropriate  Cognitive:  Appropriate  Insight:  Appropriate  Engagement in Group:  Engaged  Modes of Intervention:  Activity and Discussion  Summary of Progress/Problems: Discussed ways to cope with mental health disorders. Implemented painting as an example of coping with mental health disorders.

## 2019-03-19 NOTE — Progress Notes (Addendum)
[facility]  03/19/2019 10:59 AM Paul Brown  MRN:  [id number] Subjective: "My mom and I got into it again."  Mr. Marczak found sitting in the dayroom. He presents with euthymic affect, calm and cooperative. He admits to recent conflict with his mother again and will not be able to return to his mother's home at discharge. He states that his mother is controlling and "does not want me to work on my music or get a job." He appears future-oriented and states plan to go to a homeless shelter at discharge. He has been interacting appropriately on the unit. He reports good sleep and appetite. He denies SI/HI/AVH. No delusional thought content expressed. He is med compliant.  From admission H&P: This is a repeat admission for this 28 year old patient with a known schizoaffective bipolar type condition, cannabis dependency, and despite recent stabilization here, in fact discharged on 12/2 once again required hospitalization he had been volatile at home.  He apparently kicked in a door was argumentative and volatile with his mother   Principal Problem: <principal problem not specified> Diagnosis: Active Problems:   Schizoaffective disorder, bipolar type ([place])  Total Time spent with patient: 15 minutes  Past Psychiatric History: See admission H&P  Past Medical History:  Past Medical History:  Diagnosis Date  . Anxiety depression  . Bipolar 1 disorder ([place])   . Depression   . Mood disorder ([place]) anxiety   History reviewed. No pertinent surgical history. Family History: History reviewed. No pertinent family history. Family Psychiatric  History: See admission H&P Social History:  Social History   Substance and Sexual Activity  Alcohol Use No     Social History   Substance and Sexual Activity  Drug Use No    Social History   Socioeconomic History  . Marital status: Single    Spouse name: Not on file  . Number of children: Not on file  . Years of education: Not on file  .  Highest education level: Not on file  Occupational History  . Not on file  Tobacco Use  . Smoking status: Current Every Day Smoker    Packs/day: 0.50  . Smokeless tobacco: Never Used  Substance and Sexual Activity  . Alcohol use: No  . Drug use: No  . Sexual activity: Yes    Birth control/protection: Condom  Other Topics Concern  . Not on file  Social History Narrative  . Not on file   Social Determinants of Health   Financial Resource Strain:   . Difficulty of Paying Living Expenses: Not on file  Food Insecurity:   . Worried About Charity fundraiser in the Last Year: Not on file  . Ran Out of Food in the Last Year: Not on file  Transportation Needs:   . Lack of Transportation (Medical): Not on file  . Lack of Transportation (Non-Medical): Not on file  Physical Activity:   . Days of Exercise per Week: Not on file  . Minutes of Exercise per Session: Not on file  Stress:   . Feeling of Stress : Not on file  Social Connections:   . Frequency of Communication with Friends and Family: Not on file  . Frequency of Social Gatherings with Friends and Family: Not on file  . Attends Religious Services: Not on file  . Active Member of Clubs or Organizations: Not on file  . Attends Archivist Meetings: Not on file  . Marital Status: Not on file   Additional Social  History:    Pain Medications: see MAR Prescriptions: see MAR Over the Counter: see MAR                    Sleep: Good  Appetite:  Good  Current Medications: Current Facility-Administered Medications  Medication Dose Route Frequency Provider Last Rate Last Admin  . acetaminophen (TYLENOL) tablet 650 mg  650 mg Oral Q6H PRN Anike, Adaku C, NP      . alum & mag hydroxide-simeth (MAALOX/MYLANTA) 200-200-20 MG/5ML suspension 30 mL  30 mL Oral Q4H PRN Anike, Adaku C, NP      . carbamazepine (TEGRETOL) chewable tablet 100 mg  100 mg Oral BID Antonieta Pert, MD   100 mg at 03/19/19 0810  .  hydrOXYzine (ATARAX/VISTARIL) tablet 25 mg  25 mg Oral TID PRN Anike, Adaku C, NP   25 mg at 03/18/19 2108  . magnesium hydroxide (MILK OF MAGNESIA) suspension 30 mL  30 mL Oral Daily PRN Anike, Adaku C, NP      . OLANZapine zydis (ZYPREXA) disintegrating tablet 15 mg  15 mg Oral QHS Antonieta Pert, MD   15 mg at 03/18/19 2109  . traZODone (DESYREL) tablet 100 mg  100 mg Oral QHS PRN Antonieta Pert, MD      . traZODone (DESYREL) tablet 50 mg  50 mg Oral QHS PRN Anike, Adaku C, NP   50 mg at 03/18/19 2108    Lab Results:  Results for orders placed or performed during the hospital encounter of 03/16/19 (from the past 48 hour(s))  Urinalysis, Complete w Microscopic     Status: Abnormal   Collection Time: 03/17/19  1:00 PM  Result Value Ref Range   Color, Urine YELLOW YELLOW   APPearance HAZY (A) CLEAR   Specific Gravity, Urine 1.030 1.005 - 1.030   pH 6.0 5.0 - 8.0   Glucose, UA NEGATIVE NEGATIVE mg/dL   Hgb urine dipstick NEGATIVE NEGATIVE   Bilirubin Urine NEGATIVE NEGATIVE   Ketones, ur NEGATIVE NEGATIVE mg/dL   Protein, ur 30 (A) NEGATIVE mg/dL   Nitrite NEGATIVE NEGATIVE   Leukocytes,Ua NEGATIVE NEGATIVE   RBC / HPF 0-5 0 - 5 RBC/hpf   WBC, UA 0-5 0 - 5 WBC/hpf   Bacteria, UA NONE SEEN NONE SEEN   Mucus PRESENT    Uric Acid Crys, UA PRESENT     Comment: Performed at [facility], [address] [place], [place] [address]  Carbamazepine level, total     Status: Abnormal   Collection Time: 03/17/19  6:24 PM  Result Value Ref Range   Carbamazepine Lvl 2.7 (L) 4.0 - 12.0 ug/mL    Comment: Performed at [facility], [address] [place], [place] [address]  CBC with Differential/Platelet     Status: None   Collection Time: 03/17/19  6:24 PM  Result Value Ref Range   WBC 7.8 4.0 - 10.5 K/uL   RBC 4.27 4.22 - 5.81 MIL/uL   Hemoglobin 13.8 13.0 - 17.0 g/dL   HCT 70.9 62.8 - 36.6 %   MCV 99.8 80.0 - 100.0 fL   MCH 32.3 26.0 - 34.0 pg   MCHC 32.4  30.0 - 36.0 g/dL   RDW 29.4 76.5 - 46.5 %   Platelets 246 150 - 400 K/uL   nRBC 0.0 0.0 - 0.2 %   Neutrophils Relative % 55 %   Neutro Abs 4.2 1.7 - 7.7 K/uL   Lymphocytes Relative 33 %  Lymphs Abs 2.6 0.7 - 4.0 K/uL   Monocytes Relative 11 %   Monocytes Absolute 0.9 0.1 - 1.0 K/uL   Eosinophils Relative 1 %   Eosinophils Absolute 0.1 0.0 - 0.5 K/uL   Basophils Relative 0 %   Basophils Absolute 0.0 0.0 - 0.1 K/uL   Immature Granulocytes 0 %   Abs Immature Granulocytes 0.03 0.00 - 0.07 K/uL    Comment: Performed at [facility], [address] [place], [place] [address]    Blood Alcohol level:  Lab Results  Component Value Date   [facility] <10 03/17/2019   ETH <10 03/03/2019    Metabolic Disorder Labs: Lab Results  Component Value Date   HGBA1C 6.0 (H) 03/17/2019   MPG 125.5 03/17/2019   MPG 119.76 03/03/2019   Lab Results  Component Value Date   PROLACTIN 10.0 03/17/2019   PROLACTIN 6.0 03/03/2019   Lab Results  Component Value Date   CHOL 151 03/17/2019   TRIG 52 03/17/2019   HDL 56 03/17/2019   CHOLHDL 2.7 03/17/2019   VLDL 10 03/17/2019   LDLCALC 85 03/17/2019   LDLCALC 65 03/03/2019    Physical Findings: AIMS: Facial and Oral Movements Muscles of Facial Expression: None, normal Lips and Perioral Area: None, normal Jaw: None, normal Tongue: None, normal,Extremity Movements Upper (arms, wrists, hands, fingers): None, normal Lower (legs, knees, ankles, toes): None, normal, Trunk Movements Neck, shoulders, hips: None, normal, Overall Severity Severity of abnormal movements (highest score from questions above): None, normal Incapacitation due to abnormal movements: None, normal Patient's awareness of abnormal movements (rate only patient's report): No Awareness, Dental Status Current problems with teeth and/or dentures?: No Does patient usually wear dentures?: No  CIWA:  CIWA-Ar Total: 1 COWS:  COWS Total Score:  3  Musculoskeletal: Strength & Muscle Tone: within normal limits Gait & Station: normal Patient leans: N/A  Psychiatric Specialty Exam: Physical Exam  Nursing note and vitals reviewed. Constitutional: He is oriented to person, place, and time. He appears well-developed and well-nourished.  Cardiovascular: Normal rate.  Respiratory: Effort normal.  Neurological: He is alert and oriented to person, place, and time.    Review of Systems  Constitutional: Negative.   Respiratory: Negative for cough and shortness of breath.   Gastrointestinal: Negative for nausea and vomiting.  Psychiatric/Behavioral: Negative for agitation, behavioral problems, hallucinations, sleep disturbance and suicidal ideas.    Blood pressure 127/82, pulse 95, temperature 98 F (36.7 C), temperature source Oral, resp. rate 18, height  (1.676 m), weight 50.3 kg, SpO2 100 %.Body mass index is 17.92 kg/m.  General Appearance: Casual  Eye Contact:  Good  Speech:  Normal Rate  Volume:  Normal  Mood:  Euthymic  Affect:  Appropriate and Congruent  Thought Process:  Coherent  Orientation:  Full (Time, Place, and Person)  Thought Content:  Logical  Suicidal Thoughts:  No  Homicidal Thoughts:  No  Memory:  Immediate;   Good Recent;   Good  Judgement:  Intact  Insight:  Fair  Psychomotor Activity:  Normal  Concentration:  Concentration: Good and Attention Span: Good  Recall:  Good  Fund of Knowledge:  Fair  Language:  Good  Akathisia:  No  Handed:  Right  AIMS (if indicated):     Assets:  Communication Skills Desire for Improvement Leisure Time Resilience  ADL's:  Intact  Cognition:  WNL  Sleep:  Number of Hours: 7     Treatment Plan Summary: Daily contact with patient to assess  and evaluate symptoms and progress in treatment and Medication management   Continue inpatient hospitalization.  Continue Zyprexa 15 mg PO QHS for mood instability Continue Tegretol 100 mg PO BID for mood  instability Continue Vistaril 25 mg PO TID PRN anxiety Continue trazodone 50 mg PO QHS PRN insomnia  Patient will participate in the therapeutic group milieu.  Discharge disposition in progress.   Aldean BakerJanet E Sykes, NP 03/19/2019, 10:59 AM   Agree with NP note

## 2019-03-20 NOTE — Progress Notes (Signed)
Patient has been up and active on the unit, attended group this evening and has voiced no complaints. Patient currently denies having pain, -si/hi/a/v hall. Support and encouragement offered, safety maintained on unit, will continue to monitor.  

## 2019-03-20 NOTE — Progress Notes (Signed)
   03/19/19 2207  Psych Admission Type (Psych Patients Only)  Admission Status Involuntary  Psychosocial Assessment  Patient Complaints None  Eye Contact Fair  Facial Expression Animated  Affect Angry  Speech Logical/coherent  Interaction Assertive  Motor Activity Other (Comment) (WNL)  Appearance/Hygiene Unremarkable  Behavior Characteristics Cooperative;Appropriate to situation  Mood Anxious;Pleasant  Thought Process  Coherency WDL  Content WDL  Delusions None reported or observed  Perception WDL  Hallucination None reported or observed  Judgment Limited  Confusion None  Danger to Self  Current suicidal ideation? Denies  Danger to Others  Danger to Others None reported or observed

## 2019-03-20 NOTE — Progress Notes (Signed)
[facility] Notes:  (Nursing/MHT/Case Management/Adjunct)  Date:  03/20/2019   Time:  900  Type of Therapy:  Nurse Education [employer] for 2021  Participation Level:  Did not attend  Summary of Progress/Problems:  Shayn Madole, [place]

## 2019-03-20 NOTE — Plan of Care (Signed)
Progress note  D: pt found in bed; compliant with medication administration. Pt denies any physical complaints or pain. Pt is bright and pleasant. Pt states they slept well. Pt was reclusive to their room most of the day. Pt denies si/hi/ah/vh and verbally agrees to approach staff if these become apparent or before harming themself/others while at [place].  A: Pt provided support and encouragement. Pt given medication per protocol and standing orders. Q65m safety checks implemented and continued.  R: Pt safe on the unit. Will continue to monitor.  Pt progressing in the following metrics  Problem: Education: Goal: Knowledge of [facility] Education information/materials will improve Outcome: Progressing Goal: Emotional status will improve Outcome: Progressing Goal: Mental status will improve Outcome: Progressing

## 2019-03-20 NOTE — Progress Notes (Addendum)
[facility] MD Progress Note  03/20/2019 11:21 AM Paul Brown  MRN:  [id number] Subjective:  "I'm trying to get some rest."  Paul Brown found sitting in the dayroom. He continues to present with euthymic affect and has been calm and cooperative on the unit. He does express some anxiety related to loss of housing from conflict with his mother. His mother has stated he may not return to her home. He plans to go to a homeless shelter at discharge and states he wants to "get his life together" so that he can be a better father for his daughter. He called [facility] on Friday and they are supposed to answer him tomorrow. He reports good sleep and appetite. He denies SI/HI/AVH. No delusional content expressed. He is med compliant.  From admission H&P: This is a repeat admission for this 28 year old patient with a known schizoaffective bipolar type condition, cannabis dependency, and despite recent stabilization here, in fact discharged on 12/2 once again required hospitalization he had been volatile at home. He apparently kicked in a door was argumentative and volatile with his mother  Principal Problem: <principal problem not specified> Diagnosis: Active Problems:   Schizoaffective disorder, bipolar type ([facility])  Total Time spent with patient: 15 minutes  Past Psychiatric History: See admission H&P  Past Medical History:  Past Medical History:  Diagnosis Date  . Anxiety depression  . Bipolar 1 disorder ([facility])   . Depression   . Mood disorder ([facility]) anxiety   History reviewed. No pertinent surgical history. Family History: History reviewed. No pertinent family history. Family Psychiatric  History: See admission H&P Social History:  Social History   Substance and Sexual Activity  Alcohol Use No     Social History   Substance and Sexual Activity  Drug Use No    Social History   Socioeconomic History  . Marital status: Single    Spouse name: Not on file  . Number of  children: Not on file  . Years of education: Not on file  . Highest education level: Not on file  Occupational History  . Not on file  Tobacco Use  . Smoking status: Current Every Day Smoker    Packs/day: 0.50  . Smokeless tobacco: Never Used  Substance and Sexual Activity  . Alcohol use: No  . Drug use: No  . Sexual activity: Yes    Birth control/protection: Condom  Other Topics Concern  . Not on file  Social History Narrative  . Not on file   Social Determinants of Health   Financial Resource Strain:   . Difficulty of Paying Living Expenses: Not on file  Food Insecurity:   . Worried About Programme researcher, broadcasting/film/video in the Last Year: Not on file  . Ran Out of Food in the Last Year: Not on file  Transportation Needs:   . Lack of Transportation (Medical): Not on file  . Lack of Transportation (Non-Medical): Not on file  Physical Activity:   . Days of Exercise per Week: Not on file  . Minutes of Exercise per Session: Not on file  Stress:   . Feeling of Stress : Not on file  Social Connections:   . Frequency of Communication with Friends and Family: Not on file  . Frequency of Social Gatherings with Friends and Family: Not on file  . Attends Religious Services: Not on file  . Active Member of Clubs or Organizations: Not on file  . Attends Banker Meetings: Not on file  .  Marital Status: Not on file   Additional Social History:    Pain Medications: see MAR Prescriptions: see MAR Over the Counter: see MAR                    Sleep: Good  Appetite:  Good  Current Medications: Current Facility-Administered Medications  Medication Dose Route Frequency Provider Last Rate Last Admin  . acetaminophen (TYLENOL) tablet 650 mg  650 mg Oral Q6H PRN Anike, Adaku C, NP      . alum & mag hydroxide-simeth (MAALOX/MYLANTA) 200-200-20 MG/5ML suspension 30 mL  30 mL Oral Q4H PRN Anike, Adaku C, NP      . carbamazepine (TEGRETOL) chewable tablet 100 mg  100 mg Oral BID  Antonieta Pertlary, Greg Lawson, MD   100 mg at 03/20/19 16100812  . hydrOXYzine (ATARAX/VISTARIL) tablet 25 mg  25 mg Oral TID PRN Anike, Adaku C, NP   25 mg at 03/19/19 2207  . magnesium hydroxide (MILK OF MAGNESIA) suspension 30 mL  30 mL Oral Daily PRN Anike, Adaku C, NP      . OLANZapine zydis (ZYPREXA) disintegrating tablet 15 mg  15 mg Oral QHS Antonieta Pertlary, Greg Lawson, MD   15 mg at 03/19/19 2206  . traZODone (DESYREL) tablet 50 mg  50 mg Oral QHS PRN Anike, Adaku C, NP   50 mg at 03/19/19 2206    Lab Results: No results found for this or any previous visit (from the past 48 hour(s)).  Blood Alcohol level:  Lab Results  Component Value Date   ETH <10 03/17/2019   ETH <10 03/03/2019    Metabolic Disorder Labs: Lab Results  Component Value Date   HGBA1C 6.0 (H) 03/17/2019   MPG 125.5 03/17/2019   MPG 119.76 03/03/2019   Lab Results  Component Value Date   PROLACTIN 10.0 03/17/2019   PROLACTIN 6.0 03/03/2019   Lab Results  Component Value Date   CHOL 151 03/17/2019   TRIG 52 03/17/2019   HDL 56 03/17/2019   CHOLHDL 2.7 03/17/2019   VLDL 10 03/17/2019   LDLCALC 85 03/17/2019   LDLCALC 65 03/03/2019    Physical Findings: AIMS: Facial and Oral Movements Muscles of Facial Expression: None, normal Lips and Perioral Area: None, normal Jaw: None, normal Tongue: None, normal,Extremity Movements Upper (arms, wrists, hands, fingers): None, normal Lower (legs, knees, ankles, toes): None, normal, Trunk Movements Neck, shoulders, hips: None, normal, Overall Severity Severity of abnormal movements (highest score from questions above): None, normal Incapacitation due to abnormal movements: None, normal Patient's awareness of abnormal movements (rate only patient's report): No Awareness, Dental Status Current problems with teeth and/or dentures?: No Does patient usually wear dentures?: No  CIWA:  CIWA-Ar Total: 1 COWS:  COWS Total Score: 3  Musculoskeletal: Strength & Muscle Tone: within  normal limits Gait & Station: normal Patient leans: N/A  Psychiatric Specialty Exam: Physical Exam  Nursing note and vitals reviewed. Constitutional: He is oriented to person, place, and time. He appears well-developed and well-nourished.  Cardiovascular: Normal rate.  Respiratory: Effort normal.  Neurological: He is alert and oriented to person, place, and time.    Review of Systems  Constitutional: Negative.   Respiratory: Negative for cough and shortness of breath.   Psychiatric/Behavioral: Negative for agitation, behavioral problems, dysphoric mood, hallucinations, self-injury, sleep disturbance and suicidal ideas. The patient is not hyperactive.     Blood pressure 126/87, pulse 91, temperature 97.8 F (36.6 C), temperature source Oral, resp. rate 18, height 5\' 6"  (1.676  m), weight 50.3 kg, SpO2 100 %.Body mass index is 17.92 kg/m.  General Appearance: Casual  Eye Contact:  Good  Speech:  Normal Rate  Volume:  Normal  Mood:  Euthymic  Affect:  Congruent  Thought Process:  Coherent and Goal Directed  Orientation:  Full (Time, Place, and Person)  Thought Content:  Logical  Suicidal Thoughts:  No  Homicidal Thoughts:  No  Memory:  Immediate;   Good Recent;   Good  Judgement:  Intact  Insight:  Fair  Psychomotor Activity:  Normal  Concentration:  Concentration: Good and Attention Span: Good  Recall:  Good  Fund of Knowledge:  Fair  Language:  Good  Akathisia:  No  Handed:  Right  AIMS (if indicated):     Assets:  Communication Skills Desire for Improvement Leisure Time Resilience  ADL's:  Intact  Cognition:  WNL  Sleep:  Number of Hours: 5.25     Treatment Plan Summary: Daily contact with patient to assess and evaluate symptoms and progress in treatment and Medication management   Continue inpatient hospitalization.  Continue Zyprexa 15 mg PO QHS for mood instability Continue Tegretol 100 mg PO BID for mood instability Continue Vistaril 25 mg PO TID PRN  anxiety Continue trazodone 50 mg PO QHS PRN insomnia  Patient will participate in the therapeutic group milieu.  Discharge disposition in progress.   Connye Burkitt, NP 03/20/2019, 11:21 AM   Agree with NP note

## 2019-03-21 MED ORDER — ARIPIPRAZOLE 2 MG PO TABS
2.0000 mg | ORAL_TABLET | Freq: Once | ORAL | Status: AC
  Administered 2019-03-21: 2 mg via ORAL
  Filled 2019-03-21 (×2): qty 1

## 2019-03-21 NOTE — Progress Notes (Signed)
   03/21/19 2200  Psych Admission Type (Psych Patients Only)  Admission Status Involuntary  Psychosocial Assessment  Patient Complaints None  Eye Contact Fair  Facial Expression Animated  Affect Appropriate to circumstance  Speech Logical/coherent  Interaction Assertive  Motor Activity Slow  Appearance/Hygiene Unremarkable  Behavior Characteristics Cooperative  Mood Pleasant  Thought Process  Coherency WDL  Content WDL  Delusions None reported or observed  Perception WDL  Hallucination None reported or observed  Judgment WDL  Confusion None  Danger to Self  Current suicidal ideation? Denies  Danger to Others  Danger to Others None reported or observed

## 2019-03-21 NOTE — Progress Notes (Signed)
Recreation Therapy Notes  Date: 12.14.20 Time: 1000 Location: [address] Dayroom  Group Topic: Triggers  Goal Area(s) Addresses:  Patient will effectively identify triggers.  Patient will identify strategies to avoid/reduce exposure to triggers. Patient will identify strategies to deal with triggers head on.  Behavioral Response: Engaged  Intervention:  Worksheet, pencils  Activity: Triggers.  Patients were to identify their biggest triggers, ways to deal with triggers head on and ways to avoid triggers when they can't be avoided.  Education: Communication, Discharge Planning  Education Outcome: Acknowledges understanding/In group clarification offered/Needs additional education.   Clinical Observations/Feedback: Pt identified biggest triggers as repeating himself, people saying something then doing the opposite and bullying.  Pt avoids triggers by speaking in an articulate way, separate self from problem and doing activities and listening to music.  Pt deals with triggers head on by self reflection, prayer and a song or dance to create a positive way for help.     Victorino Sparrow, LRT/CTRS     Victorino Sparrow A 03/21/2019 11:55 AM

## 2019-03-21 NOTE — Progress Notes (Signed)
[facility] MD Progress Note  03/21/2019 10:30 AM Paul Brown  MRN:  [id number] Subjective:    This is a repeat admission for this 28 year old patient with a known schizoaffective bipolar type condition, cannabis dependency, and despite recent stabilization here, in fact discharged on 12/2/once again required hospitalization he had been volatile at home.  He apparently kicked in a door was argumentative and volatile with his mother so forth and since he has been admitted he has been informed is not welcome back in the family home-  Patient seen today he is alert oriented reports an uneventful weekend he reports compliance and believes the meds are "working" he denies auditory or visual hallucinations he understands what it is to contract for safety.  No EPS or TD noted.  He does however bulk when I tell him he must stay away from all sorts of cannabis and synthetic cannabis so forth but at any rate he knows he is not welcome in his family home states "I got to find a shelter" were going to wait another day prior to discharge further, it is raining pretty heavily today and he has nowhere to go at present  Principal Problem: Exacerbation of underlying psychotic disorder Diagnosis: Active Problems:   Schizoaffective disorder, bipolar type ([place])  Total Time spent with patient: 20 minutes  Past Psychiatric History: Prior episodes discussed in HPI  Past Medical History:  Past Medical History:  Diagnosis Date  . Anxiety depression  . Bipolar 1 disorder ([place])   . Depression   . Mood disorder ([place]) anxiety   History reviewed. No pertinent surgical history. Family History: History reviewed. No pertinent family history. Family Psychiatric  History: No new data shared Social History:  Social History   Substance and Sexual Activity  Alcohol Use No     Social History   Substance and Sexual Activity  Drug Use No    Social History   Socioeconomic History  . Marital status: Single    Spouse  name: Not on file  . Number of children: Not on file  . Years of education: Not on file  . Highest education level: Not on file  Occupational History  . Not on file  Tobacco Use  . Smoking status: Current Every Day Smoker    Packs/day: 0.50  . Smokeless tobacco: Never Used  Substance and Sexual Activity  . Alcohol use: No  . Drug use: No  . Sexual activity: Yes    Birth control/protection: Condom  Other Topics Concern  . Not on file  Social History Narrative  . Not on file   Social Determinants of Health   Financial Resource Strain:   . Difficulty of Paying Living Expenses: Not on file  Food Insecurity:   . Worried About Charity fundraiser in the Last Year: Not on file  . Ran Out of Food in the Last Year: Not on file  Transportation Needs:   . Lack of Transportation (Medical): Not on file  . Lack of Transportation (Non-Medical): Not on file  Physical Activity:   . Days of Exercise per Week: Not on file  . Minutes of Exercise per Session: Not on file  Stress:   . Feeling of Stress : Not on file  Social Connections:   . Frequency of Communication with Friends and Family: Not on file  . Frequency of Social Gatherings with Friends and Family: Not on file  . Attends Religious Services: Not on file  . Active Member of Clubs or Organizations:  Not on file  . Attends BankerClub or Organization Meetings: Not on file  . Marital Status: Not on file   Additional Social History:    Pain Medications: see MAR Prescriptions: see MAR Over the Counter: see MAR                    Sleep: Good  Appetite:  Good  Current Medications: Current Facility-Administered Medications  Medication Dose Route Frequency Provider Last Rate Last Admin  . acetaminophen (TYLENOL) tablet 650 mg  650 mg Oral Q6H PRN Anike, Adaku C, NP      . alum & mag hydroxide-simeth (MAALOX/MYLANTA) 200-200-20 MG/5ML suspension 30 mL  30 mL Oral Q4H PRN Anike, Adaku C, NP      . carbamazepine (TEGRETOL)  chewable tablet 100 mg  100 mg Oral BID Antonieta Pertlary, Greg Lawson, MD   100 mg at 03/21/19 0813  . hydrOXYzine (ATARAX/VISTARIL) tablet 25 mg  25 mg Oral TID PRN Anike, Adaku C, NP   25 mg at 03/20/19 2114  . magnesium hydroxide (MILK OF MAGNESIA) suspension 30 mL  30 mL Oral Daily PRN Anike, Adaku C, NP      . OLANZapine zydis (ZYPREXA) disintegrating tablet 15 mg  15 mg Oral QHS Antonieta Pertlary, Greg Lawson, MD   15 mg at 03/20/19 2113  . traZODone (DESYREL) tablet 50 mg  50 mg Oral QHS PRN Anike, Adaku C, NP   50 mg at 03/20/19 2114    Lab Results: No results found for this or any previous visit (from the past 48 hour(s)).  Blood Alcohol level:  Lab Results  Component Value Date   ETH <10 03/17/2019   ETH <10 03/03/2019    Metabolic Disorder Labs: Lab Results  Component Value Date   HGBA1C 6.0 (H) 03/17/2019   MPG 125.5 03/17/2019   MPG 119.76 03/03/2019   Lab Results  Component Value Date   PROLACTIN 10.0 03/17/2019   PROLACTIN 6.0 03/03/2019   Lab Results  Component Value Date   CHOL 151 03/17/2019   TRIG 52 03/17/2019   HDL 56 03/17/2019   CHOLHDL 2.7 03/17/2019   VLDL 10 03/17/2019   LDLCALC 85 03/17/2019   LDLCALC 65 03/03/2019    Physical Findings: AIMS: Facial and Oral Movements Muscles of Facial Expression: None, normal Lips and Perioral Area: None, normal Jaw: None, normal Tongue: None, normal,Extremity Movements Upper (arms, wrists, hands, fingers): None, normal Lower (legs, knees, ankles, toes): None, normal, Trunk Movements Neck, shoulders, hips: None, normal, Overall Severity Severity of abnormal movements (highest score from questions above): None, normal Incapacitation due to abnormal movements: None, normal Patient's awareness of abnormal movements (rate only patient's report): No Awareness, Dental Status Current problems with teeth and/or dentures?: No Does patient usually wear dentures?: No  CIWA:  CIWA-Ar Total: 1 COWS:  COWS Total Score:  3  Musculoskeletal: Strength & Muscle Tone: within normal limits Gait & Station: normal Patient leans: N/A  Psychiatric Specialty Exam: Physical Exam  Review of Systems  Blood pressure 113/66, pulse 93, temperature 98 F (36.7 C), temperature source Oral, resp. rate 18, height 5\' 6"  (1.676 m), weight 50.3 kg, SpO2 100 %.Body mass index is 17.92 kg/m.  General Appearance: Casual  Eye Contact:  Good  Speech:  Clear and Coherent  Volume:  Normal  Mood:  Euthymic  Affect:  Congruent  Thought Process:  Goal Directed and Descriptions of Associations: Circumstantial  Orientation:  Full (Time, Place, and Person)  Thought Content:  Logical and Denies  auditory or visual hallucinations denies thoughts of harming self or others  Suicidal Thoughts:  No  Homicidal Thoughts:  No  Memory:  Immediate;   Fair Recent;   Fair Remote;   Fair  Judgement:  Fair  Insight:  Fair  Psychomotor Activity:  Normal  Concentration:  Concentration: Fair and Attention Span: Fair  Recall:  [employer] of Knowledge:  Fair  Language:  Good  Akathisia: Not detected on exam  Handed:  Right  AIMS (if indicated):     Assets:  Resilience  ADL's:  Intact  Cognition:  WNL  Sleep:  Number of Hours: 5.5     Treatment Plan Summary: Daily contact with patient to assess and evaluate symptoms and progress in treatment and Medication management no change in current medications or precautions probable discharge tomorrow aripiprazole test dose and anticipation of long-acting injectable prior to release no change in precautions today  Maybree Riling, MD 03/21/2019, 10:30 AM

## 2019-03-21 NOTE — BHH Group Notes (Signed)
LCSW Group Therapy Notes  Type of Therapy and Topic: Group Therapy: Core Beliefs  Participation Level: Active  Description of Group: In this group patients will be encouraged to explore their negative and positive core beliefs about themselves, others, and the world. Each patient will be challenged to identify these beliefs and ways to challenge negative core beliefs. This group will be process-oriented, with patients participating in exploration of their own experiences as well as giving and receiving support and challenge from other group members.  Therapeutic Goals: 1. Patient will identify personal core beliefs, both negative and positive. 2. Patient will identify core beliefs relating to others, both negative and positive. 3. Patient will challenge their negative beliefs about themselves and others. 4. Patient will identify three changes they can make to replace negative core beliefs with positive beliefs.  Summary of Patient Progress  Due to the COVID-19 pandemic, this group has been supplemented with worksheets.  Therapeutic Modalities: Cognitive Behavioral Therapy Solution Focused Therapy Motivational Interviewing  

## 2019-03-22 NOTE — Progress Notes (Signed)
Recreation Therapy Notes  Date: 12.15.20 Time: 0945 Location: [address] Dayroom  Group Topic: Wellness  Goal Area(s) Addresses:  Patient will define components of whole wellness. Patient will verbalize benefit of whole wellness.  Behavioral Response: Engaged  Intervention: Music    Activity:  Exercise.  LRT led patients in a series of stretches to get them loosened up.  Patients then led the group in exercises of their choosing.  Patients were to give at least 30 minutes of exercise.  Patients were also encouraged to get water and take breaks when needed.  Patients were also encouraged to pay attention to any aches or pain they may experience and skip those exercises.  Education: Wellness, Dentist.   Education Outcome: Acknowledges education/In group clarification offered/Needs additional education.   Clinical Observations/Feedback: Pt started off a little slow in group.  Pt energy picked up as group went on.  Pt led group in exercises that focused mainly on the abdominal area.  Pt was social and engaged with peers.    Victorino Sparrow, LRT/CTRS    Victorino Sparrow A 03/22/2019 10:38 AM

## 2019-03-22 NOTE — Progress Notes (Signed)
[facility] MD Progress Note  03/22/2019 12:49 PM Paul Brown  MRN:  [id number] Subjective:   Patient continues to work with social work regarding housing he is alert oriented cooperative denies all positive symptoms at this point in time.  Again no thoughts of harming self or others Principal Problem: History of schizoaffective disorder complicated by cannabis dependency and recent stabilization here about homelessness is also an issue volatility at home, not welcome back home/synthetic cannabis abuse leading to more volatility Diagnosis: Active Problems:   Schizoaffective disorder, bipolar type ([facility])  Total Time spent with patient: 20 minutes  Past Psychiatric History: Prior similar presentations  Past Medical History:  Past Medical History:  Diagnosis Date  . Anxiety depression  . Bipolar 1 disorder ([place])   . Depression   . Mood disorder ([place]) anxiety   History reviewed. No pertinent surgical history. Family History: History reviewed. No pertinent family history. Family Psychiatric  History: see eval Social History:  Social History   Substance and Sexual Activity  Alcohol Use No     Social History   Substance and Sexual Activity  Drug Use No    Social History   Socioeconomic History  . Marital status: Single    Spouse name: Not on file  . Number of children: Not on file  . Years of education: Not on file  . Highest education level: Not on file  Occupational History  . Not on file  Tobacco Use  . Smoking status: Current Every Day Smoker    Packs/day: 0.50  . Smokeless tobacco: Never Used  Substance and Sexual Activity  . Alcohol use: No  . Drug use: No  . Sexual activity: Yes    Birth control/protection: Condom  Other Topics Concern  . Not on file  Social History Narrative  . Not on file   Social Determinants of Health   Financial Resource Strain:   . Difficulty of Paying Living Expenses: Not on file  Food Insecurity:   . Worried About Charity fundraiser  in the Last Year: Not on file  . Ran Out of Food in the Last Year: Not on file  Transportation Needs:   . Lack of Transportation (Medical): Not on file  . Lack of Transportation (Non-Medical): Not on file  Physical Activity:   . Days of Exercise per Week: Not on file  . Minutes of Exercise per Session: Not on file  Stress:   . Feeling of Stress : Not on file  Social Connections:   . Frequency of Communication with Friends and Family: Not on file  . Frequency of Social Gatherings with Friends and Family: Not on file  . Attends Religious Services: Not on file  . Active Member of Clubs or Organizations: Not on file  . Attends Archivist Meetings: Not on file  . Marital Status: Not on file   Additional Social History:    Pain Medications: see MAR Prescriptions: see MAR Over the Counter: see MAR                    Sleep: Fair  Appetite:  Fair  Current Medications: Current Facility-Administered Medications  Medication Dose Route Frequency Provider Last Rate Last Admin  . acetaminophen (TYLENOL) tablet 650 mg  650 mg Oral Q6H PRN Anike, Adaku C, NP      . alum & mag hydroxide-simeth (MAALOX/MYLANTA) 200-200-20 MG/5ML suspension 30 mL  30 mL Oral Q4H PRN Anike, Adaku C, NP      .  carbamazepine (TEGRETOL) chewable tablet 100 mg  100 mg Oral BID Antonieta Pert, MD   100 mg at 03/22/19 5625  . hydrOXYzine (ATARAX/VISTARIL) tablet 25 mg  25 mg Oral TID PRN Anike, Adaku C, NP   25 mg at 03/21/19 2056  . magnesium hydroxide (MILK OF MAGNESIA) suspension 30 mL  30 mL Oral Daily PRN Anike, Adaku C, NP      . OLANZapine zydis (ZYPREXA) disintegrating tablet 15 mg  15 mg Oral QHS Antonieta Pert, MD   15 mg at 03/21/19 2056  . traZODone (DESYREL) tablet 50 mg  50 mg Oral QHS PRN Anike, Adaku C, NP   50 mg at 03/21/19 2056    Lab Results: No results found for this or any previous visit (from the past 48 hour(s)).  Blood Alcohol level:  Lab Results  Component Value  Date   ETH <10 03/17/2019   ETH <10 03/03/2019    Metabolic Disorder Labs: Lab Results  Component Value Date   HGBA1C 6.0 (H) 03/17/2019   MPG 125.5 03/17/2019   MPG 119.76 03/03/2019   Lab Results  Component Value Date   PROLACTIN 10.0 03/17/2019   PROLACTIN 6.0 03/03/2019   Lab Results  Component Value Date   CHOL 151 03/17/2019   TRIG 52 03/17/2019   HDL 56 03/17/2019   CHOLHDL 2.7 03/17/2019   VLDL 10 03/17/2019   LDLCALC 85 03/17/2019   LDLCALC 65 03/03/2019    Physical Findings: AIMS: Facial and Oral Movements Muscles of Facial Expression: None, normal Lips and Perioral Area: None, normal Jaw: None, normal Tongue: None, normal,Extremity Movements Upper (arms, wrists, hands, fingers): None, normal Lower (legs, knees, ankles, toes): None, normal, Trunk Movements Neck, shoulders, hips: None, normal, Overall Severity Severity of abnormal movements (highest score from questions above): None, normal Incapacitation due to abnormal movements: None, normal Patient's awareness of abnormal movements (rate only patient's report): No Awareness, Dental Status Current problems with teeth and/or dentures?: No Does patient usually wear dentures?: No  CIWA:  CIWA-Ar Total: 1 COWS:  COWS Total Score: 3  Normal gait and station normal muscle tone no EPS or TD  Psychiatric Specialty Exam: Physical Exam  Review of Systems  Blood pressure 120/71, pulse 96, temperature 98 F (36.7 C), temperature source Oral, resp. rate 18, height 5\' 6"  (1.676 m), weight 50.3 kg, SpO2 100 %.Body mass index is 17.92 kg/m.  General Appearance: Casual  Eye Contact:  Good  Speech:  Clear and Coherent  Volume:  Normal  Mood:  Euthymic  Affect:  Congruent  Thought Process:  Coherent and Goal Directed  Orientation:  Full (Time, Place, and Person)  Thought Content:  Logical  Suicidal Thoughts:  No  Homicidal Thoughts:  No  Memory:  Immediate;   Good Recent;   Good Remote;   Good  Judgement:   Good  Insight:  Good  Psychomotor Activity:  Normal  Concentration:  Concentration: Fair  Recall:  Good  Fund of Knowledge:  Fair  Language:  nl  Akathisia:  Negative  Handed:  Right  AIMS (if indicated):     Assets:  Leisure Time Physical Health  ADL's:  Intact  Cognition:  WNL  Sleep:  Number of Hours: 6.5     Treatment Plan Summary: Daily contact with patient to assess and evaluate symptoms and progress in treatment and Medication management  Continue current cognitive therapy probable discharge tomorrow no change in meds or precautions  Coltin Casher, MD 03/22/2019, 12:49 PM

## 2019-03-22 NOTE — BHH Counselor (Signed)
CSW spoke with patient on unit regarding discharge planning. Patient has diligently called [facility] and followed up with individual homeless shelters to secure a bed at discharge. [facility] unfortunately won't have beds available until next Monday (12/21). Patient was encouraged to continue following up after discharge.  CSW shared with patient that due to cold weather, the [facility] is operating "white flag" shelter operations every evening at 7pm. Patient agreeable to follow up tomorrow and is aware that this is an evening shelter in cold weather and not a day shelter.  Stephanie Acre, MSW, [place] Social Worker [facility]  [phone]

## 2019-03-22 NOTE — Progress Notes (Signed)
The patient shared with the group that he had a meltdown during the daytime but had learned from the incident. He explained further that he is trying to take things one day at a time and that he expects to be discharged tomorrow. His goal for tomorrow is to "calm down" and avoid having "melt downs".

## 2019-03-23 MED ORDER — CARBAMAZEPINE 100 MG PO CHEW
200.0000 mg | CHEWABLE_TABLET | Freq: Two times a day (BID) | ORAL | Status: DC
  Filled 2019-03-23 (×3): qty 28

## 2019-03-23 MED ORDER — TRAZODONE HCL 100 MG PO TABS: 100.0000 mg | ORAL_TABLET | Freq: Every evening | ORAL | 1 refills | Status: AC | PRN

## 2019-03-23 MED ORDER — CARBAMAZEPINE 100 MG PO CHEW: CHEWABLE_TABLET | ORAL | 1 refills | Status: AC

## 2019-03-23 MED ORDER — OLANZAPINE 15 MG PO TBDP: 15.0000 mg | ORAL_TABLET | Freq: Every day | ORAL | Status: AC

## 2019-03-23 MED ORDER — TRAZODONE HCL 100 MG PO TABS
100.0000 mg | ORAL_TABLET | Freq: Every evening | ORAL | Status: DC | PRN
  Filled 2019-03-23: qty 7

## 2019-03-23 NOTE — BHH Suicide Risk Assessment (Signed)
[facility] Discharge Suicide Risk Assessment   Principal Problem: Exacerbation of underlying schizoaffective condition Discharge Diagnoses: Active Problems:   Schizoaffective disorder, bipolar type ([place])   Total Time spent with patient: 45 minutes Mental Status Per Nursing Assessment::   On Admission:  NA Musculoskeletal: Strength & Muscle Tone: within normal limits Gait & Station: normal Patient leans: N/A  Psychiatric Specialty Exam: Physical Exam  Review of Systems  Blood pressure 122/71, pulse (!) 101, temperature 98.2 F (36.8 C), temperature source Oral, resp. rate 18, height 5\' 6"  (1.676 m), weight 50.3 kg, SpO2 100 %.Body mass index is 17.92 kg/m.  General Appearance: Casual  Eye Contact:  Good  Speech:  Clear and Coherent  Volume:  Normal  Mood:  Euthymic  Affect:  Congruent  Thought Process:  Goal Directed  Orientation:  Full (Time, Place, and Person)  Thought Content:  Logical  Suicidal Thoughts:  No  Homicidal Thoughts:  No  Memory:  Immediate;   Good Recent;   Good Remote;   Good  Judgement:  Good  Insight:  Good  Psychomotor Activity:  Normal  Concentration:  Concentration: Good and Attention Span: Good  Recall:  Good  Fund of Knowledge:  Good  Language:  Good  Akathisia:  Negative  Handed:  Right  AIMS (if indicated):     Assets:  Leisure Time Physical Health  ADL's:  Intact  Cognition:  WNL  Sleep:  Number of Hours: 6.5    Demographic Factors:  Male  Loss Factors: Decrease in vocational status  Historical Factors: Impulsivity  Risk Reduction Factors:   Sense of responsibility to family, Religious beliefs about death and Positive therapeutic relationship  Continued Clinical Symptoms:  Previous Psychiatric Diagnoses and Treatments  Cognitive Features That Contribute To Risk:  None    Suicide Risk:  Minimal: No identifiable suicidal ideation.  Patients presenting with no risk factors but with morbid ruminations; may be classified as  minimal risk based on the severity of the depressive symptoms  Follow-up Information    [facility] on 04/13/2019.   Why: You are scheduled with Titus Mould on Thursday, December 17th at 10:00am.  This is an in person appointment.  Please remember to wear a mask. Contact information: [address] [place] [address] [phone]           Plan Of Care/Follow-up recommendations:  Activity:  full  Laszlo Ellerby, MD 03/23/2019, 7:55 AM

## 2019-03-23 NOTE — Discharge Summary (Signed)
Physician Discharge Summary Note  Patient:  Paul Brown is an 28 y.o., male MRN:  [id number] DOB:  1990-06-06 Patient phone:  [phone] (home)  Patient address:   [place] [address],  Total Time spent with patient: 45 minutes  Date of Admission:  03/16/2019 Date of Discharge: 03/23/2019  Reason for Admission:  This is a repeat admission for this 28 year old patient with a known schizoaffective bipolar type condition, cannabis dependency, and despite recent stabilization here, in fact discharged on 12/2/once again required hospitalization he had been volatile at home.  He apparently kicked in a door was argumentative and volatile with his mother so forth and since he has been admitted he has been informed is not welcome back in the family home  When I interviewed the patient once again he just blames issues on "his mother" and states that "we just disagree" and speaks vaguely and will not elaborate as to whether he is having hallucinations/will not elaborate about his drug use/compliance so forth.  Discharge summary of 12/2 reads as follows Mr. Kleckner was admitted under IVC from his family for reports of property destruction, smoking K2, and threatening to kill his mother. He denied reports on IVC paperwork throughout admission.He remained on [facility] unit forsevendays. He was started onTegretol, Zyprexa, Restoril, trazodone, and Vistaril.He participated in group therapy on the unit. He responded well to treatment with no adverse effects reported. He admitted to anger at his family for committing him but showed calm mood during admission.No agitated or disruptive behaviors on the unit.He denies any SI/HI/AVH and contracts for safety. He shows no signs of responding to internal stimuli. No paranoid or delusional thought content expressed.He is discharging on the medications listed below. [facility] Patient is provided with  prescriptions for medications upon discharge.He is discharging to a hotel via [place]. Principal Problem: schizoaffective Discharge Diagnoses: Active Problems:   Schizoaffective disorder, bipolar type [facility])  Past Medical History:  Past Medical History:  Diagnosis Date  . Anxiety depression  . Bipolar 1 disorder ([place])   . Depression   . Mood disorder ([place]) anxiety   History reviewed. No pertinent surgical history. Family History: History reviewed. No pertinent family history. Family Psychiatric  History: see eval Social History:  Social History   Substance and Sexual Activity  Alcohol Use No     Social History   Substance and Sexual Activity  Drug Use No    Social History   Socioeconomic History  . Marital status: Single    Spouse name: Not on file  . Number of children: Not on file  . Years of education: Not on file  . Highest education level: Not on file  Occupational History  . Not on file  Tobacco Use  . Smoking status: Current Every Day Smoker    Packs/day: 0.50  . Smokeless tobacco: Never Used  Substance and Sexual Activity  . Alcohol use: No  . Drug use: No  . Sexual activity: Yes    Birth control/protection: Condom  Other Topics Concern  . Not on file  Social History Narrative  . Not on file   Social Determinants of Health   Financial Resource Strain:   . Difficulty of Paying Living Expenses: Not on file  Food Insecurity:   . Worried About Charity fundraiser in the Last Year: Not on file  . Ran Out of Food in the Last Year: Not on file  Transportation Needs:   .  Lack of Transportation (Medical): Not on file  . Lack of Transportation (Non-Medical): Not on file  Physical Activity:   . Days of Exercise per Week: Not on file  . Minutes of Exercise per Session: Not on file  Stress:   . Feeling of Stress : Not on file  Social Connections:   . Frequency of Communication with Friends and Family: Not on file  . Frequency of Social Gatherings with  Friends and Family: Not on file  . Attends Religious Services: Not on file  . Active Member of Clubs or Organizations: Not on file  . Attends BankerClub or Organization Meetings: Not on file  . Marital Status: Not on file    Hospital Course:    Patient was readmitted as discussed and he recalibrated very quickly once he got back on his meds.  He displayed no dangerous behaviors here.  He reported no auditory or visual hallucinations at the point of discharge, no thoughts of harming self or others continue to minimize the issues at home but knows he is not welcome there. At the point of discharge alert and oriented no thoughts of harming self of course doing very well and ready for release to the shelter he continues to resist long-acting injectable so we will keep him on the oral Tegretol/Zyprexa  Physical Findings: AIMS: Facial and Oral Movements Muscles of Facial Expression: None, normal Lips and Perioral Area: None, normal Jaw: None, normal Tongue: None, normal,Extremity Movements Upper (arms, wrists, hands, fingers): None, normal Lower (legs, knees, ankles, toes): None, normal, Trunk Movements Neck, shoulders, hips: None, normal, Overall Severity Severity of abnormal movements (highest score from questions above): None, normal Incapacitation due to abnormal movements: None, normal Patient's awareness of abnormal movements (rate only patient's report): No Awareness, Dental Status Current problems with teeth and/or dentures?: No Does patient usually wear dentures?: No  CIWA:  CIWA-Ar Total: 1 COWS:  COWS Total Score: 3  Musculoskeletal: Strength & Muscle Tone: within normal limits Gait & Station: normal Patient leans: N/A  Psychiatric Specialty Exam: Physical Exam  Review of Systems  Blood pressure 122/71, pulse (!) 101, temperature 98.2 F (36.8 C), temperature source Oral, resp. rate 18, height 5\' 6"  (1.676 m), weight 50.3 kg, SpO2 100 %.Body mass index is 17.92 kg/m.  General  Appearance: Casual  Eye Contact:  Good  Speech:  Clear and Coherent  Volume:  Normal  Mood:  Euthymic  Affect:  Congruent  Thought Process:  Goal Directed  Orientation:  Full (Time, Place, and Person)  Thought Content:  Logical  Suicidal Thoughts:  No  Homicidal Thoughts:  No  Memory:  Immediate;   Good Recent;   Good Remote;   Good  Judgement:  Good  Insight:  Good  Psychomotor Activity:  Normal  Concentration:  Concentration: Good and Attention Span: Good  Recall:  Good  Fund of Knowledge:  Good  Language:  Good  Akathisia:  Negative  Handed:  Right  AIMS (if indicated):     Assets:  Leisure Time Physical Health  ADL's:  Intact  Cognition:  WNL  Sleep:  Number of Hours: 6.5        Has this patient used any form of tobacco in the last 30 days? (Cigarettes, Smokeless Tobacco, Cigars, and/or Pipes) Yes, No  Blood Alcohol level:  Lab Results  Component Value Date   [facility] 03/17/2019   ETH <10 03/03/2019    Metabolic Disorder Labs:  Lab Results  Component Value Date   HGBA1C  6.0 (H) 03/17/2019   MPG 125.5 03/17/2019   MPG 119.76 03/03/2019   Lab Results  Component Value Date   PROLACTIN 10.0 03/17/2019   PROLACTIN 6.0 03/03/2019   Lab Results  Component Value Date   CHOL 151 03/17/2019   TRIG 52 03/17/2019   HDL 56 03/17/2019   CHOLHDL 2.7 03/17/2019   VLDL 10 03/17/2019   LDLCALC 85 03/17/2019   LDLCALC 65 03/03/2019    See Psychiatric Specialty Exam and Suicide Risk Assessment completed by Attending Physician prior to discharge.  Discharge destination:  Home  Is patient on multiple antipsychotic therapies at discharge:  No   Has Patient had three or more failed trials of antipsychotic monotherapy by history:  No  Recommended Plan for Multiple Antipsychotic Therapies: NA   Allergies as of 03/23/2019   No Known Allergies     Medication List    STOP taking these medications   OLANZapine 15 MG tablet Commonly known as: ZYPREXA Replaced  by: olanzapine zydis 15 MG disintegrating tablet   temazepam 15 MG capsule Commonly known as: RESTORIL     TAKE these medications     Indication  carbamazepine 100 MG chewable tablet Commonly known as: TEGRETOL 2 bid What changed:   how much to take  how to take this  when to take this  additional instructions  Indication: Manic-Depression   hydrOXYzine 25 MG tablet Commonly known as: ATARAX/VISTARIL Take 1 tablet (25 mg total) by mouth 3 (three) times daily as needed for anxiety.  Indication: Feeling Anxious   olanzapine zydis 15 MG disintegrating tablet Commonly known as: ZYPREXA Take 1 tablet (15 mg total) by mouth at bedtime. Replaces: OLANZapine 15 MG tablet  Indication: Hypomanic Episode of Bipolar Disorder   traZODone 100 MG tablet Commonly known as: DESYREL Take 1 tablet (100 mg total) by mouth at bedtime as needed for sleep.  Indication: Trouble Sleeping      Follow-up [employer]. Go on 04/13/2019.   Why: You are scheduled with Gaylyn Cheers on Thursday, December 17th at 10:00am.  This is an in person appointment.  Please remember to wear a mask. Contact information: [address] [place] [address] [phone]          SignedMalvin Johns, MD 03/23/2019, 7:31 AM

## 2019-03-23 NOTE — Plan of Care (Signed)
Pt was able to identify positive coping skills at completion of recreation therapy group sessions.   [name], LRT/CTRS  

## 2019-03-23 NOTE — Progress Notes (Signed)
   03/23/19 0000  Psych Admission Type (Psych Patients Only)  Admission Status Involuntary  Psychosocial Assessment  Patient Complaints None  Eye Contact Fair  Facial Expression Animated  Affect Appropriate to circumstance  Speech Logical/coherent  Interaction Assertive  Motor Activity Slow  Appearance/Hygiene Unremarkable  Behavior Characteristics Cooperative  Mood Pleasant  Thought Process  Coherency WDL  Content WDL  Delusions None reported or observed  Perception WDL  Hallucination None reported or observed  Judgment WDL  Confusion None  Danger to Self  Current suicidal ideation? Denies  Danger to Others  Danger to Others None reported or observed

## 2019-03-23 NOTE — Progress Notes (Signed)
  [facility] Adult Case Management Discharge Plan :  Will you be returning to the same living situation after discharge:  No. Discharging to a shelter. At discharge, do you have transportation home?: Yes,  Lyft transportation at 2:30pm. Do you have the ability to pay for your medications: No. Referred to [facility].   Release of information consent forms completed and in the chart; letter on chart. Patient to Follow up at: Follow-up Information    [facility]. Go on 04/13/2019.   Why: You are scheduled with Titus Mould on Thursday, December 17th at 10:00am.  This is an in person appointment.  Please remember to wear a mask. Contact information: [place] [address] [phone]           Next level of care provider has access to [place] and Suicide Prevention discussed: Yes,  with patient. Patient declined consents.    Has patient been referred to the Quitline?: N/A patient is not a smoker  Patient has been referred for addiction treatment: Yes  Joellen Jersey, [place] 03/23/2019, 9:21 AM

## 2019-03-23 NOTE — Progress Notes (Signed)
Recreation Therapy Notes  INPATIENT RECREATION TR PLAN  Patient Details Name: Paul Brown MRN: [id number] DOB: 07-11-1990 Today's Date: 03/23/2019  Rec Therapy Plan Is patient appropriate for Therapeutic Recreation?: Yes Treatment times per week: about 3 days Estimated Length of Stay: 5-7 days TR Treatment/Interventions: Group participation (Comment)  Discharge Criteria Pt will be discharged from therapy if:: Discharged Treatment plan/goals/alternatives discussed and agreed upon by:: Patient/family  Discharge Summary Short term goals set: See patient care plan Short term goals met: Complete Progress toward goals comments: Groups attended Which groups?: Self-esteem, Wellness, Other (Comment)(Triggers; Team building) Reason goals not met: None Therapeutic equipment acquired: N/A Reason patient discharged from therapy: Discharge from hospital Pt/family agrees with progress & goals achieved: Yes Date patient discharged from therapy: 03/23/19    Victorino Sparrow, LRT/CTRS  Ria Comment, [name] 03/23/2019, 11:43 AM

## 2019-03-23 NOTE — Tx Team (Signed)
Interdisciplinary Treatment and Diagnostic Plan Update  03/23/2019 Time of Session: 10:00am Paul Brown MRN: [id number]  Principal Diagnosis: <principal problem not specified>  Secondary Diagnoses: Active Problems:   Schizoaffective disorder, bipolar type (HCC)   Current Medications:  Current Facility-Administered Medications  Medication Dose Route Frequency Provider Last Rate Last Admin  . acetaminophen (TYLENOL) tablet 650 mg  650 mg Oral Q6H PRN Anike, Adaku C, NP      . alum & mag hydroxide-simeth (MAALOX/MYLANTA) 200-200-20 MG/5ML suspension 30 mL  30 mL Oral Q4H PRN Anike, Adaku C, NP      . carbamazepine (TEGRETOL) chewable tablet 100 mg  100 mg Oral BID Antonieta Pert, MD   100 mg at 03/23/19 0805  . hydrOXYzine (ATARAX/VISTARIL) tablet 25 mg  25 mg Oral TID PRN Anike, Adaku C, NP   25 mg at 03/22/19 2105  . magnesium hydroxide (MILK OF MAGNESIA) suspension 30 mL  30 mL Oral Daily PRN Anike, Adaku C, NP      . OLANZapine zydis (ZYPREXA) disintegrating tablet 15 mg  15 mg Oral QHS Antonieta Pert, MD   15 mg at 03/22/19 2106  . traZODone (DESYREL) tablet 50 mg  50 mg Oral QHS PRN Anike, Adaku C, NP   50 mg at 03/22/19 2105   PTA Medications: Medications Prior to Admission  Medication Sig Dispense Refill Last Dose  . carbamazepine (TEGRETOL) 100 MG chewable tablet Chew 1 tablet (100 mg total) by mouth 2 (two) times daily. For mood sabilization 60 tablet 0 Unknown at Unknown time  . hydrOXYzine (ATARAX/VISTARIL) 25 MG tablet Take 1 tablet (25 mg total) by mouth 3 (three) times daily as needed for anxiety. 60 tablet 0 Unknown at Unknown time  . OLANZapine (ZYPREXA) 15 MG tablet Take 1 tablet (15 mg total) by mouth at bedtime. For mood control 30 tablet 0 Unknown at Unknown time  . temazepam (RESTORIL) 15 MG capsule Take 1 capsule (15 mg total) by mouth at bedtime. For sleep 30 capsule 0 Unknown at Unknown time  . [DISCONTINUED] traZODone (DESYREL) 100 MG tablet Take 1  tablet (100 mg total) by mouth at bedtime as needed for sleep. 30 tablet 0 Unknown at Unknown time    Patient Stressors:    Patient Strengths:    Treatment Modalities: Medication Management, Group therapy, Case management,  1 to 1 session with clinician, Psychoeducation, Recreational therapy.   Physician Treatment Plan for Primary Diagnosis: <principal problem not specified> Long Term Goal(s): Improvement in symptoms so as ready for discharge   Short Term Goals: Ability to demonstrate self-control will improve Ability to identify and develop effective coping behaviors will improve Compliance with prescribed medications will improve  Medication Management: Evaluate patient's response, side effects, and tolerance of medication regimen.  Therapeutic Interventions: 1 to 1 sessions, Unit Group sessions and Medication administration.  Evaluation of Outcomes: Adequate for Discharge  Physician Treatment Plan for Secondary Diagnosis: Active Problems:   Schizoaffective disorder, bipolar type (HCC)  Long Term Goal(s): Improvement in symptoms so as ready for discharge   Short Term Goals: Ability to demonstrate self-control will improve Ability to identify and develop effective coping behaviors will improve Compliance with prescribed medications will improve     Medication Management: Evaluate patient's response, side effects, and tolerance of medication regimen.  Therapeutic Interventions: 1 to 1 sessions, Unit Group sessions and Medication administration.  Evaluation of Outcomes: Adequate for Discharge   RN Treatment Plan for Primary Diagnosis: <principal problem not specified> Long Term Goal(s):  Knowledge of disease and therapeutic regimen to maintain health will improve  Short Term Goals: Ability to verbalize frustration and anger appropriately will improve, Ability to participate in decision making will improve, Ability to verbalize feelings will improve, Ability to identify and  develop effective coping behaviors will improve and Compliance with prescribed medications will improve  Medication Management: RN will administer medications as ordered by provider, will assess and evaluate patient's response and provide education to patient for prescribed medication. RN will report any adverse and/or side effects to prescribing provider.  Therapeutic Interventions: 1 on 1 counseling sessions, Psychoeducation, Medication administration, Evaluate responses to treatment, Monitor vital signs and CBGs as ordered, Perform/monitor CIWA, COWS, AIMS and Fall Risk screenings as ordered, Perform wound care treatments as ordered.  Evaluation of Outcomes: Adequate for Discharge   LCSW Treatment Plan for Primary Diagnosis: <principal problem not specified> Long Term Goal(s): Safe transition to appropriate next level of care at discharge, Engage patient in therapeutic group addressing interpersonal concerns.  Short Term Goals: Engage patient in aftercare planning with referrals and resources, Increase emotional regulation, Identify triggers associated with mental health/substance abuse issues and Increase skills for wellness and recovery  Therapeutic Interventions: Assess for all discharge needs, 1 to 1 time with Social worker, Explore available resources and support systems, Assess for adequacy in community support network, Educate family and significant other(s) on suicide prevention, Complete Psychosocial Assessment, Interpersonal group therapy.  Evaluation of Outcomes: Adequate for Discharge  Progress in Treatment: Attending groups: Yes  Participating in groups: Yes. Taking medication as prescribed: Yes. Toleration medication: Yes. Family/Significant other contact made: Yes, individual(s) contacted:  reviewed SPE with patient, patient declined consents. Patient understands diagnosis: Yes. Discussing patient identified problems/goals with staff: Yes. Medical problems stabilized or  resolved: Yes. Denies suicidal/homicidal ideation: Yes. Issues/concerns per patient self-inventory: Yes.  New problem(s) identified: Yes, Describe:  family conflict, legal issues, financial stressors. Recently discharged, per IVC not medication compliant and threatened family.  New Short Term/Long Term Goal(s): medication management for mood stabilization; elimination of SI thoughts; development of comprehensive mental wellness/sobriety plan.  Patient Goals:  Patient in shower, did not attend treatment team to state goal.  Discharge Plan or Barriers: Patient provided shelter resources and is discharging to a shelter. Patient will follow up with [facility] for outpatient therapy and medication management   Reason for Continuation of Hospitalization: Depression  Estimated Length of Stay: discharging today  Attendees: Patient: 03/23/2019 10:04 AM  Physician: Dr.Farah 03/23/2019 10:04 AM  Nursing: Benjamine Mola, RN 03/23/2019 10:04 AM  RN Care Manager: 03/23/2019 10:04 AM  Social Worker: Stephanie Acre, [place] 03/23/2019 10:04 AM  Recreational Therapist:  03/23/2019 10:04 AM  Other:  03/23/2019 10:04 AM  Other:  03/23/2019 10:04 AM  Other: 03/23/2019 10:04 AM    Scribe for Treatment Team: Joellen Jersey, [place] 03/23/2019 10:04 AM

## 2019-03-23 NOTE — Progress Notes (Signed)
Recreation Therapy Notes  Date: 12.16.20 Time: 1000 Location: 500 [address] Dayroom    Group Topic: Communication, Team Building, Problem Solving  Goal Area(s) Addresses:  Patient will effectively work with peer towards shared goal.  Patient will identify skills used to make activity successful.  Patient will identify how skills used during activity can be used to reach post d/c goals.   Behavioral Response: Engaged  Intervention: STEM Activity  Activity: Straw Bridge. In teams patients were given 15 plastic drinking straws and a length of masking tape. Using the materials provided patients were asked to build an elevated bridge that can hold a small puzzle box without collapsing.   Education: Education officer, community, Discharge Planning   Education Outcome: Acknowledges education/In group clarification offered/Needs additional education.   Clinical Observations/Feedback: Pt was active and worked well with peers.  Pt helped come up with the concept for their bridge and construction of it.  Pt expressed the group used teamwork to complete the activity.  Pt also stated teamwork could be used with your support system to work through whatever the issue may be but "you have to be willing to listen to what they have to say".     Victorino Sparrow, LRT/CTRS    Victorino Sparrow A 03/23/2019 11:26 AM

## 2019-05-21 ENCOUNTER — Emergency Department (HOSPITAL_COMMUNITY): Payer: Self-pay

## 2019-05-21 ENCOUNTER — Emergency Department (HOSPITAL_COMMUNITY)
Admission: EM | Admit: 2019-05-21 | Discharge: 2019-05-21 | Disposition: A | Discharge status: Home / Self Care | Payer: Self-pay | Attending: Emergency Medicine | Admitting: Emergency Medicine

## 2019-05-21 ENCOUNTER — Other Ambulatory Visit: Payer: Self-pay

## 2019-05-21 DIAGNOSIS — Z79899 Other long term (current) drug therapy: Secondary | ICD-10-CM | POA: Insufficient documentation

## 2019-05-21 DIAGNOSIS — F17210 Nicotine dependence, cigarettes, uncomplicated: Secondary | ICD-10-CM | POA: Insufficient documentation

## 2019-05-21 DIAGNOSIS — R0789 Other chest pain: Secondary | ICD-10-CM | POA: Insufficient documentation

## 2019-05-21 LAB — CBC
HCT: 44.9 % (ref 39.0–52.0)
Hemoglobin: 14.8 g/dL (ref 13.0–17.0)
MCH: 32 pg (ref 26.0–34.0)
MCHC: 33 g/dL (ref 30.0–36.0)
MCV: 97.2 fL (ref 80.0–100.0)
Platelets: 233 10*3/uL (ref 150–400)
RBC: 4.62 MIL/uL (ref 4.22–5.81)
RDW: 12.2 % (ref 11.5–15.5)
WBC: 6 10*3/uL (ref 4.0–10.5)
nRBC: 0 % (ref 0.0–0.2)

## 2019-05-21 LAB — BASIC METABOLIC PANEL
Anion gap: 9 (ref 5–15)
BUN: 9 mg/dL (ref 6–20)
CO2: 29 mmol/L (ref 22–32)
Calcium: 9.5 mg/dL (ref 8.9–10.3)
Chloride: 103 mmol/L (ref 98–111)
Creatinine, Ser: 0.89 mg/dL (ref 0.61–1.24)
GFR calc Af Amer: >60 mL/min (ref >60)
GFR calc non Af Amer: >60 mL/min (ref >60)
Glucose, Bld: 122 mg/dL — ABNORMAL HIGH (ref 70–99)
Potassium: 3.2 mmol/L — ABNORMAL LOW (ref 3.5–5.1)
Sodium: 141 mmol/L (ref 135–145)

## 2019-05-21 LAB — TROPONIN I (HIGH SENSITIVITY): Troponin I (High Sensitivity): 3 ng/L (ref <18)

## 2019-05-21 MED ORDER — SODIUM CHLORIDE 0.9% FLUSH: 3.0000 mL | Freq: Once | INTRAVENOUS | Status: DC

## 2019-05-21 NOTE — Discharge Instructions (Signed)
Your testing here today did not show any abnormalities.  Your heart related testing was normal.  Your other laboratory test did not show any significant abnormalities.  Your chest x-ray was normal.

## 2019-05-21 NOTE — ED Triage Notes (Signed)
Pt presents with c/o some chest pain and shortness of breath. Pt is in NAD at this time.

## 2019-05-21 NOTE — ED Provider Notes (Signed)
[facility] DEPT Provider Note   CSN: [id number] Arrival date & time: 05/21/19  1615     History Chief Complaint  Patient presents with  . Chest Pain    Paul Brown is a 29 y.o. male.  HPI Patient presents to the emergency department with chest pain, abdominal pain that is been ongoing for several weeks.  The patient states that it seemed to start after he was fired from his job and he is concerned that the restaurant was dirty and that is why he is sick.  He is also stating that he is worried that he may have Covid.  The patient states that the house he lives in currently is dirty but not dirty.  The patient states that he has been eating a lot of Ramen noodles and he is concerned that may be causing some abdominal discomfort for him.  The patient states that the chest pain moves all around both sides of his chest.  The patient states that it lasts for 5 seconds or less.  Patient states he is not currently having any abdominal pain but he is having sharp pains when they are present on the left side of his abdomen.  The patient denies shortness of breath, headache,blurred vision, neck pain, fever, cough, weakness, numbness, dizziness, anorexia, edema, nausea, vomiting, diarrhea, rash, back pain, dysuria, hematemesis, bloody stool, near syncope, or syncope.    Past Medical History:  Diagnosis Date  . Anxiety depression  . Bipolar 1 disorder ([place])   . Depression   . Mood disorder ([place]) anxiety    Patient Active Problem List   Diagnosis Date Noted  . Cannabis use disorder, moderate, dependence ([place]) 06/23/2014  . Schizoaffective disorder, bipolar type ([place]) 06/22/2014    No past surgical history on file.     No family history on file.  Social History   Tobacco Use  . Smoking status: Current Every Day Smoker    Packs/day: 0.50  . Smokeless tobacco: Never Used  Substance Use Topics  . Alcohol use: No  . Drug use: No    Home  Medications Prior to Admission medications   Medication Sig Start Date End Date Taking? Authorizing Provider  carbamazepine (TEGRETOL) 100 MG chewable tablet 2 bid 03/23/19   Johnn Hai, MD  hydrOXYzine (ATARAX/VISTARIL) 25 MG tablet Take 1 tablet (25 mg total) by mouth 3 (three) times daily as needed for anxiety. 03/09/19   Lindell Spar I, NP  OLANZapine zydis (ZYPREXA) 15 MG disintegrating tablet Take 1 tablet (15 mg total) by mouth at bedtime. 03/23/19   Johnn Hai, MD  traZODone (DESYREL) 100 MG tablet Take 1 tablet (100 mg total) by mouth at bedtime as needed for sleep. 03/23/19   Johnn Hai, MD    Allergies    Patient has no known allergies.  Review of Systems   Review of Systems All other systems negative except as documented in the HPI. All pertinent positives and negatives as reviewed in the HPI. Physical Exam Updated Vital Signs BP (!) 173/92   Pulse 72   Temp 98.3 F (36.8 C) (Oral)   Resp (!) 21   SpO2 99%   Physical Exam Vitals and nursing note reviewed.  Constitutional:      General: He is not in acute distress.    Appearance: He is well-developed.  HENT:     Head: Normocephalic and atraumatic.  Eyes:     Pupils: Pupils are equal, round, and reactive to light.  Cardiovascular:  Rate and Rhythm: Normal rate and regular rhythm.     Heart sounds: Normal heart sounds. No murmur. No friction rub. No gallop.   Pulmonary:     Effort: Pulmonary effort is normal. No respiratory distress.     Breath sounds: Normal breath sounds. No wheezing, rhonchi or rales.  Abdominal:     General: Bowel sounds are normal. There is no distension.     Palpations: Abdomen is soft.     Tenderness: There is no abdominal tenderness.  Musculoskeletal:     Cervical back: Normal range of motion and neck supple.  Skin:    General: Skin is warm and dry.     Capillary Refill: Capillary refill takes less than 2 seconds.     Findings: No erythema or rash.  Neurological:     Mental  Status: He is alert and oriented to person, place, and time.     Motor: No abnormal muscle tone.     Coordination: Coordination normal.  Psychiatric:        Behavior: Behavior normal.     ED Results / Procedures / Treatments   Labs (all labs ordered are listed, but only abnormal results are displayed) Labs Reviewed  BASIC METABOLIC PANEL - Abnormal; Notable for the following components:      Result Value   Potassium 3.2 (*)    Glucose, Bld 122 (*)    All other components within normal limits  CBC  TROPONIN I (HIGH SENSITIVITY)    EKG EKG Interpretation  Date/Time:  Saturday May 21 2019 16:31:19 EST Ventricular Rate:  93 PR Interval:    QRS Duration: 84 QT Interval:  328 QTC Calculation: 408 R Axis:   94 Text Interpretation: Sinus rhythm Borderline short PR interval Right atrial enlargement Borderline right axis deviation Borderline repolarization abnormality ST elevation, consider anterior injury No significant change since last tracing Confirmed by Linwood Dibbles [id number]) on 05/21/2019 4:33:57 PM   Radiology DG Chest 2 View  Result Date: 05/21/2019 CLINICAL DATA:  Pt presents with c/o some chest pain and SOB. Smoker. EXAM: CHEST - 2 VIEW COMPARISON:  None. FINDINGS: The heart size and mediastinal contours are within normal limits. The lungs are clear. No pneumothorax or pleural effusion. The visualized skeletal structures are unremarkable. IMPRESSION: No acute cardiopulmonary process. Electronically Signed   By: Emmaline Kluver M.D.   On: 05/21/2019 16:58    Procedures Procedures (including critical care time)  Medications Ordered in ED Medications  sodium chloride flush (NS) 0.9 % injection 3 mL (has no administration in time range)    ED Course  I have reviewed the triage vital signs and the nursing notes.  Pertinent labs & imaging results that were available during my care of the patient were reviewed by me and considered in my medical decision making (see  chart for details).    MDM Rules/Calculators/A&P                      Patient's complaints are very vague and scattered and seem to focus on more social issues than anything else.  The patient's symptoms are pretty atypical for any significant abnormality.  Do feel that the patient will need follow-up with his primary doctor to discuss these issues.  Patient's laboratory testing here today does not show any abnormalities.  The duration of the symptoms is been ongoing over the last several weeks. Final Clinical Impression(s) / ED Diagnoses Final diagnoses:  None    Rx / DC Orders  ED Discharge Orders    None       Charlestine Night, Cordelia Poche 05/21/19 2052    Melene Plan, DO 05/21/19 2220

## 2019-05-24 ENCOUNTER — Other Ambulatory Visit: Payer: Self-pay

## 2019-05-24 ENCOUNTER — Encounter (HOSPITAL_COMMUNITY): Payer: Self-pay

## 2019-05-24 ENCOUNTER — Emergency Department (HOSPITAL_COMMUNITY)
Admission: EM | Admit: 2019-05-24 | Discharge: 2019-05-24 | Disposition: A | Discharge status: Home / Self Care | Payer: Self-pay | Attending: Emergency Medicine | Admitting: Emergency Medicine

## 2019-05-24 ENCOUNTER — Emergency Department (HOSPITAL_COMMUNITY): Payer: Self-pay

## 2019-05-24 DIAGNOSIS — F17210 Nicotine dependence, cigarettes, uncomplicated: Secondary | ICD-10-CM | POA: Insufficient documentation

## 2019-05-24 DIAGNOSIS — T5492XA Toxic effect of unspecified corrosive substance, intentional self-harm, initial encounter: Secondary | ICD-10-CM

## 2019-05-24 DIAGNOSIS — F329 Major depressive disorder, single episode, unspecified: Secondary | ICD-10-CM | POA: Insufficient documentation

## 2019-05-24 DIAGNOSIS — R1033 Periumbilical pain: Secondary | ICD-10-CM | POA: Insufficient documentation

## 2019-05-24 DIAGNOSIS — Z79899 Other long term (current) drug therapy: Secondary | ICD-10-CM | POA: Insufficient documentation

## 2019-05-24 DIAGNOSIS — R45851 Suicidal ideations: Secondary | ICD-10-CM | POA: Insufficient documentation

## 2019-05-24 DIAGNOSIS — T543X2A Toxic effect of corrosive alkalis and alkali-like substances, intentional self-harm, initial encounter: Secondary | ICD-10-CM | POA: Insufficient documentation

## 2019-05-24 DIAGNOSIS — F250 Schizoaffective disorder, bipolar type: Secondary | ICD-10-CM | POA: Insufficient documentation

## 2019-05-24 LAB — CBC WITH DIFFERENTIAL/PLATELET
Abs Immature Granulocytes: 0.01 10*3/uL (ref 0.00–0.07)
Basophils Absolute: 0 10*3/uL (ref 0.0–0.1)
Basophils Relative: 0 %
Eosinophils Absolute: 0.1 10*3/uL (ref 0.0–0.5)
Eosinophils Relative: 1 %
HCT: 45.8 % (ref 39.0–52.0)
Hemoglobin: 15.1 g/dL (ref 13.0–17.0)
Immature Granulocytes: 0 %
Lymphocytes Relative: 30 %
Lymphs Abs: 1.6 10*3/uL (ref 0.7–4.0)
MCH: 32.2 pg (ref 26.0–34.0)
MCHC: 33 g/dL (ref 30.0–36.0)
MCV: 97.7 fL (ref 80.0–100.0)
Monocytes Absolute: 0.6 10*3/uL (ref 0.1–1.0)
Monocytes Relative: 12 %
Neutro Abs: 2.9 10*3/uL (ref 1.7–7.7)
Neutrophils Relative %: 57 %
Platelets: 222 10*3/uL (ref 150–400)
RBC: 4.69 MIL/uL (ref 4.22–5.81)
RDW: 12.4 % (ref 11.5–15.5)
WBC: 5.2 10*3/uL (ref 4.0–10.5)
nRBC: 0 % (ref 0.0–0.2)

## 2019-05-24 LAB — COMPREHENSIVE METABOLIC PANEL
ALT: 20 U/L (ref 0–44)
AST: 17 U/L (ref 15–41)
Albumin: 4.5 g/dL (ref 3.5–5.0)
Alkaline Phosphatase: 50 U/L (ref 38–126)
Anion gap: 11 (ref 5–15)
BUN: 13 mg/dL (ref 6–20)
CO2: 24 mmol/L (ref 22–32)
Calcium: 9.3 mg/dL (ref 8.9–10.3)
Chloride: 103 mmol/L (ref 98–111)
Creatinine, Ser: 1.04 mg/dL (ref 0.61–1.24)
GFR calc Af Amer: >60 mL/min (ref >60)
GFR calc non Af Amer: >60 mL/min (ref >60)
Glucose, Bld: 89 mg/dL (ref 70–99)
Potassium: 3.6 mmol/L (ref 3.5–5.1)
Sodium: 138 mmol/L (ref 135–145)
Total Bilirubin: 0.6 mg/dL (ref 0.3–1.2)
Total Protein: 7.8 g/dL (ref 6.5–8.1)

## 2019-05-24 LAB — LACTIC ACID, PLASMA: Lactic Acid, Venous: 0.9 mmol/L (ref 0.5–1.9)

## 2019-05-24 LAB — ETHANOL: Alcohol, Ethyl (B): <10 mg/dL (ref <10)

## 2019-05-24 NOTE — ED Notes (Signed)
Pts mother informed that the pt did not give consent for information to be provided to her. Mother verbalizes understanding.

## 2019-05-24 NOTE — [facility] Suicide Risk Assessment (Cosign Needed)
Suicide Risk Assessment  Discharge Assessment   [facility] Discharge Suicide Risk Assessment   Principal Problem: <principal problem not specified> Discharge Diagnoses: Active Problems:   * No active hospital problems. *   Total Time spent with patient: 30 minutes  Musculoskeletal: Strength & Muscle Tone: within normal limits Gait & Station: normal Patient leans: N/A  Psychiatric Specialty Exam:   Blood pressure (!) 149/94, pulse 98, temperature 98 F (36.7 C), temperature source Oral, resp. rate (!) 32, height 5\' 6"  (1.676 m), weight 54.4 kg, SpO2 100 %.Body mass index is 19.37 kg/m.  General Appearance: Casual  Eye Contact::  Good  Speech:  Clear and Coherent and Normal Rate409  Volume:  Normal  Mood:  Depressed but stable  Affect:  Appropriate and Congruent  Thought Process:  Coherent, Goal Directed and Descriptions of Associations: Intact  Orientation:  Full (Time, Place, and Person)  Thought Content:  WDL  Suicidal Thoughts:  No States he doesn't want to kill himself, just depressed and needs to get his life together for his daughter.   Homicidal Thoughts:  No  Memory:  Immediate;   Good Recent;   Good  Judgement:  Intact  Insight:  Present Wants to get his life together for his daughter.    Psychomotor Activity:  Normal  Concentration:  Good  Recall:  Good  Fund of Knowledge:Good  Language: Good  Akathisia:  No  Handed:  Right  AIMS (if indicated):     Assets:  Communication Skills Desire for Improvement Social Support  Sleep:     Cognition: WNL  ADL's:  Intact   Mental Status Per Nursing Assessment::   On Admission:     Demographic Factors:  Male and Unemployed  Loss Factors: Legal issues and Financial problems/change in socioeconomic status  Historical Factors: Impulsivity  Risk Reduction Factors:   Responsible for children under 20 years of age, Sense of responsibility to family, Religious beliefs about death and Positive social support  Continued  Clinical Symptoms:  Alcohol/Substance Abuse/Dependencies Previous Psychiatric Diagnoses and Treatments  Cognitive Features That Contribute To Risk:  None    Suicide Risk:  Minimal: No identifiable suicidal ideation.  Patients presenting with no risk factors but with morbid ruminations; may be classified as minimal risk based on the severity of the depressive symptoms   Recommendation: 15. Social worker to assist with transportation  Plan Of Care/Follow-up recommendations:  Activity:  As tolerated Diet:  Heart healthy     Discharge Instructions     For your mental health needs, you are advised to continue treatment with [facility].  Call them at your earliest opportunity to schedule an intake appointment:       [facility]      [address]      [place], [place]      [phone]      Crisis number: [phone]  When facing the loss of a loved one, many people benefit from grief counseling.  Contact the [phone] (formerly [facility]) to ask about enrolling in their grief counseling program:       [facility]      [address] [place], [place]      [phone]    Disposition:  Patient psychiatrically cleared.   No evidence of imminent risk to self or others at present.   Patient does not meet criteria for psychiatric inpatient admission. Supportive therapy provided about  ongoing stressors. Discussed crisis plan, support from social network, calling 911, coming to the Emergency Department, and calling Suicide Hotline.  Katrin Grabel, NP 05/24/2019, 12:11 PM

## 2019-05-24 NOTE — ED Provider Notes (Signed)
[facility]-EMERGENCY DEPT Provider Note   CSN: [id number] Arrival date & time: 05/24/19  9983   History Chief Complaint  Patient presents with  . Ingestion  . Abdominal Pain    Paul Brown is a 29 y.o. male.  The history is provided by the EMS personnel. The history is limited by the condition of the patient (Altered mental status).  Ingestion Associated symptoms include abdominal pain.  Abdominal Pain Has history of bipolar disorder and is brought in by ambulance after claiming that he had drank some bleach last night at about 11 PM.  He is not talking to me as I am unable to get any information directly from him.  EMS reports that he had been complaining of some abdominal pain.  He is reported to have claimed intent to harm himself.  Past Medical History:  Diagnosis Date  . Anxiety depression  . Bipolar 1 disorder ([facility])   . Depression   . Mood disorder ([facility]) anxiety    Patient Active Problem List   Diagnosis Date Noted  . Cannabis use disorder, moderate, dependence ([facility]) 06/23/2014  . Schizoaffective disorder, bipolar type ([facility]) 06/22/2014   No past surgical history on file.   No family history on file.  Social History   Tobacco Use  . Smoking status: Current Every Day Smoker    Packs/day: 0.50  . Smokeless tobacco: Never Used  Substance Use Topics  . Alcohol use: No  . Drug use: No    Home Medications Prior to Admission medications   Medication Sig Start Date End Date Taking? Authorizing Provider  carbamazepine (TEGRETOL) 100 MG chewable tablet 2 bid 03/23/19   Malvin Johns, MD  hydrOXYzine (ATARAX/VISTARIL) 25 MG tablet Take 1 tablet (25 mg total) by mouth 3 (three) times daily as needed for anxiety. 03/09/19   Armandina Stammer I, NP  OLANZapine zydis (ZYPREXA) 15 MG disintegrating tablet Take 1 tablet (15 mg total) by mouth at bedtime. 03/23/19   Malvin Johns, MD  traZODone (DESYREL) 100 MG tablet Take 1 tablet (100 mg total) by mouth  at bedtime as needed for sleep. 03/23/19   Malvin Johns, MD    Allergies    Patient has no known allergies.  Review of Systems   Review of Systems  Unable to perform ROS: Mental status change  Gastrointestinal: Positive for abdominal pain.    Physical Exam Updated Vital Signs BP (!) 140/98 (BP Location: Left Arm)   Pulse 85   Temp 98 F (36.7 C) (Oral)   Resp 14   Ht 5\' 6"  (1.676 m)   Wt 54.4 kg   SpO2 100%   BMI 19.37 kg/m   Physical Exam Vitals and nursing note reviewed.   29 year old male, resting comfortably and in no acute distress. Vital signs are significant for elevated blood pressure. Oxygen saturation is 100%, which is normal. Head is normocephalic and atraumatic.  Pupils are 3-4 mm and reactive. Oropharynx is clear.  There are no intraoral or pharyngeal burns.  There is no pooling of secretions.  There is no stridor. Neck is nontender and supple without adenopathy or JVD. Back is nontender and there is no CVA tenderness. Lungs are clear without rales, wheezes, or rhonchi. Chest is nontender. Heart has regular rate and rhythm without murmur. Abdomen is soft, flat, with mild tenderness to the right of the midline in a.m. mid abdominal area.  There are no masses or hepatosplenomegaly and peristalsis is hypoactive. Extremities have no cyanosis or  edema, full range of motion is present. Skin is warm and dry without rash. Neurologic: Awake and able to follow commands but is nonverbal, cranial nerves are intact, there are no motor or sensory deficits.  ED Results / Procedures / Treatments   Labs (all labs ordered are listed, but only abnormal results are displayed) Labs Reviewed  COMPREHENSIVE METABOLIC PANEL  ETHANOL  CBC WITH DIFFERENTIAL/PLATELET  LACTIC ACID, PLASMA  LACTIC ACID, PLASMA  URINALYSIS, ROUTINE W REFLEX MICROSCOPIC  RAPID URINE DRUG SCREEN, HOSP PERFORMED    EKG EKG Interpretation  Date/Time:  Tuesday May 24 2019 06:24:44  EST Ventricular Rate:  73 PR Interval:    QRS Duration: 88 QT Interval:  382 QTC Calculation: 421 R Axis:   107 Text Interpretation: Sinus rhythm Short PR interval Borderline right axis deviation Borderline repolarization abnormality ST elevation, consider anterolateral injury When compared with ECG of 05/21/2019, No significant change was found Confirmed by Delora Fuel ([id number]) on 05/24/2019 6:52:04 AM   Radiology DG Chest Port 1 View  Result Date: 05/24/2019 CLINICAL DATA:  Overdose. Patient drank bleach. EXAM: PORTABLE CHEST 1 VIEW COMPARISON:  05/21/2019 FINDINGS: Patient is rotated. The cardiomediastinal contours are normal. No evidence of pneumomediastinum. The lungs are clear. Pulmonary vasculature is normal. No consolidation, pleural effusion, or pneumothorax. No acute osseous abnormalities are seen. IMPRESSION: Rotated but otherwise negative radiograph of the chest. Electronically Signed   By: Keith Rake M.D.   On: 05/24/2019 06:33    Procedures Procedures  CRITICAL CARE Performed by: Delora Fuel Total critical care time: 45 minutes Critical care time was exclusive of separately billable procedures and treating other patients. Critical care was necessary to treat or prevent imminent or life-threatening deterioration. Critical care was time spent personally by me on the following activities: development of treatment plan with patient and/or surrogate as well as nursing, discussions with consultants, evaluation of patient's response to treatment, examination of patient, obtaining history from patient or surrogate, ordering and performing treatments and interventions, ordering and review of laboratory studies, ordering and review of radiographic studies, pulse oximetry and re-evaluation of patient's condition.  Medications Ordered in ED Medications - No data to display  ED Course  I have reviewed the triage vital signs and the nursing notes.  Pertinent labs & imaging results  that were available during my care of the patient were reviewed by me and considered in my medical decision making (see chart for details).  MDM Rules/Calculators/A&P Bleach ingestion and apparent suicide attempts.  No evidence of serious toxicity at this time.  Will check screening labs.  We will also get consultation with poison control.  Old records are reviewed, and he has several behavioral health Hospital admissions, most recently in December 2020.  He was seen in the ED 3 days ago for chest and abdominal pain and was noted to have scattered thought processes.  ECG is unchanged from baseline.  Chest x-ray is unremarkable.  Labs are unremarkable including normal lactic acid level, normal anion gap.  Poison control has recommended patient be observed in the ED for 2 hours and obtain GI consultation if any difficulty in that time.  Case is signed out to Dr. Lacinda Axon.  He will need TTS consultation once medically cleared.  Final Clinical Impression(s) / ED Diagnoses Final diagnoses:  Ingestion of bleach, intentional self-harm, initial encounter [facility])    Rx / DC Orders ED Discharge Orders    None       Delora Fuel, MD 54/00/86 [phone]

## 2019-05-24 NOTE — ED Notes (Signed)
Poison control closed pt case at this time.

## 2019-05-24 NOTE — ED Notes (Signed)
Tele psych at bedside at this time 

## 2019-05-24 NOTE — ED Notes (Signed)
Pt resting comfortably in bed. NAD noted. Equal rise and fall of chest noted.  

## 2019-05-24 NOTE — ED Notes (Signed)
PT currently trying to use urinal at bedside.

## 2019-05-24 NOTE — ED Notes (Signed)
Awaiting transportation with safe transport
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
## 2019-05-24 NOTE — ED Notes (Signed)
Pt dc'ed via safe transport to [facility] rescue. Pt escorted out with NT

## 2019-05-24 NOTE — ED Notes (Signed)
Charge nurse contacted posion control who advised to keep pt NPO for 2 hours then try clear liquids if tolerated attempted PO challenge if pt unable to tolerate EDP to consult gastrologist.

## 2019-05-24 NOTE — Discharge Instructions (Signed)
For your mental health needs, you are advised to continue treatment with [facility].  Call them at your earliest opportunity to schedule an intake appointment:       [facility]      [address]      [place], [place] [address]      [phone]      Crisis number: [phone]  When facing the loss of a loved one, many people benefit from grief counseling.  Contact the [facility] to ask about enrolling in their grief counseling program:       [facility]      [address] [place], [place] [address]      [phone]

## 2019-05-24 NOTE — Progress Notes (Signed)
CSW spoke with patient at bedside. Patient reports he would like to go to [facility]. Patient agreed and Chief Financial Officer. Patient reports he has a Building services engineer ID and RN and NT assisted with finding patient's ID in his belongings to confirm. CSW contacted [employer]. CSW informed RN Safe Transport should be here in 30 minutes or so.   Geralyn Corwin, LCSW Transitions of Care Department [facility] ED [phone]

## 2019-05-24 NOTE — ED Triage Notes (Signed)
BIB EMS form Home. PT reports drink "the last bit" of bleach out of bottle with intent of hurting himself. This occurred apox. 2300.  Pt complains of abdominal pain and denies any other complaints.

## 2019-05-24 NOTE — ED Notes (Signed)
Pt still unable to void at this time. Pt resting in bed at this time. NAD noted. equal rise and fall of chest noted. Pt belongings in cabinet by nurses station. Pt in hospital gown until medically cleared. Pt remains calm and cooperative at this time.

## 2019-05-24 NOTE — ED Notes (Signed)
Pt provided with meal tray.

## 2019-05-24 NOTE — ED Notes (Signed)
Pts mother is at ED registration requesting to speak to pts RN. Registration took pts mothers information. Upon speaking with the pt. Pt is refusing to speak to mother at this time and is requesting that staff does not contact her or provide her with any information regarding his care. Pt is alert oriented adult pt. Pts wishes will be respected. No information will be provided.

## 2019-05-24 NOTE — BH Assessment (Signed)
[facility] Assessment Progress Note  Per Shuvon Rankin, FNP, this pt does not require psychiatric hospitalization at this time.  Pt is to be discharged from [facility] with recommendation to continue treatment with Vesta Mixer, and to contact [facility] for grief counseling.  This has been included in pt's discharge instructions.  Pt's nurse, Crystal, has been notified.  Doylene Canning, MA Triage Specialist [phone]

## 2019-05-24 NOTE — ED Provider Notes (Signed)
Awaiting placement [facility] rescue mission   Donnetta Hutching, MD 05/24/19 1043

## 2019-05-24 NOTE — ED Notes (Signed)
SW aware of need for consult. SW on the way to speak with pt at this time.

## 2019-06-07 MED ORDER — DIPHENHYDRAMINE HCL 25 MG PO CAPS
25.00 | ORAL_CAPSULE | ORAL | Status: DC
Start: ? — End: 2019-06-07

## 2019-06-07 MED ORDER — MELATONIN 3 MG PO TABS
3.00 | ORAL_TABLET | ORAL | Status: DC
Start: ? — End: 2019-06-07

## 2019-06-07 MED ORDER — OLANZAPINE 5 MG PO TABS
5.00 | ORAL_TABLET | ORAL | Status: DC
Start: ? — End: 2019-06-07

## 2019-06-07 MED ORDER — ACETAMINOPHEN 325 MG PO TABS
650.00 | ORAL_TABLET | ORAL | Status: DC
Start: ? — End: 2019-06-07

## 2019-09-27 ENCOUNTER — Other Ambulatory Visit: Payer: Self-pay

## 2019-09-27 ENCOUNTER — Encounter (HOSPITAL_COMMUNITY): Payer: Self-pay | Admitting: *Deleted

## 2019-09-27 ENCOUNTER — Emergency Department (HOSPITAL_COMMUNITY)
Admission: EM | Admit: 2019-09-27 | Discharge: 2019-09-27 | Disposition: A | Discharge status: Home / Self Care | Payer: Self-pay | Attending: Emergency Medicine | Admitting: Emergency Medicine

## 2019-09-27 DIAGNOSIS — Z79899 Other long term (current) drug therapy: Secondary | ICD-10-CM | POA: Insufficient documentation

## 2019-09-27 DIAGNOSIS — R531 Weakness: Secondary | ICD-10-CM | POA: Insufficient documentation

## 2019-09-27 DIAGNOSIS — F250 Schizoaffective disorder, bipolar type: Secondary | ICD-10-CM | POA: Insufficient documentation

## 2019-09-27 DIAGNOSIS — F17210 Nicotine dependence, cigarettes, uncomplicated: Secondary | ICD-10-CM | POA: Insufficient documentation

## 2019-09-27 LAB — CBC
HCT: 37.7 % — ABNORMAL LOW (ref 39.0–52.0)
Hemoglobin: 12.1 g/dL — ABNORMAL LOW (ref 13.0–17.0)
MCH: 30.6 pg (ref 26.0–34.0)
MCHC: 32.1 g/dL (ref 30.0–36.0)
MCV: 95.4 fL (ref 80.0–100.0)
Platelets: 301 10*3/uL (ref 150–400)
RBC: 3.95 MIL/uL — ABNORMAL LOW (ref 4.22–5.81)
RDW: 13.1 % (ref 11.5–15.5)
WBC: 7 10*3/uL (ref 4.0–10.5)
nRBC: 0 % (ref 0.0–0.2)

## 2019-09-27 LAB — BASIC METABOLIC PANEL
Anion gap: 7 (ref 5–15)
BUN: 14 mg/dL (ref 6–20)
CO2: 25 mmol/L (ref 22–32)
Calcium: 9.1 mg/dL (ref 8.9–10.3)
Chloride: 104 mmol/L (ref 98–111)
Creatinine, Ser: 0.93 mg/dL (ref 0.61–1.24)
GFR calc Af Amer: >60 mL/min (ref >60)
GFR calc non Af Amer: >60 mL/min (ref >60)
Glucose, Bld: 88 mg/dL (ref 70–99)
Potassium: 3.9 mmol/L (ref 3.5–5.1)
Sodium: 136 mmol/L (ref 135–145)

## 2019-09-27 LAB — URINALYSIS, ROUTINE W REFLEX MICROSCOPIC
Bilirubin Urine: NEGATIVE
Glucose, UA: NEGATIVE mg/dL
Hgb urine dipstick: NEGATIVE
Ketones, ur: NEGATIVE mg/dL
Leukocytes,Ua: NEGATIVE
Nitrite: NEGATIVE
Protein, ur: NEGATIVE mg/dL
Specific Gravity, Urine: 1.028 (ref 1.005–1.030)
pH: 5 (ref 5.0–8.0)

## 2019-09-27 LAB — CBG MONITORING, ED: Glucose-Capillary: 71 mg/dL (ref 70–99)

## 2019-09-27 MED ORDER — SODIUM CHLORIDE 0.9% FLUSH: 3.0000 mL | Freq: Once | INTRAVENOUS | Status: DC

## 2019-09-27 NOTE — ED Triage Notes (Signed)
Pt was brought in by EMS tonight for a week of weakness and periods of paralysis that he states happens in his hands intermittently.  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  Pt told ems he thinks he has [place] Disease and wants to be checked out.

## 2019-09-27 NOTE — Progress Notes (Signed)
CSW received consult for patient for SNF placement - patient was recently released from jail and also is uninsured. If patient is in need of social work intervention, please place additional consult reflecting the need.  Edwin Dada, MSW, LCSW-A Transitions of Care  Clinical Social Worker  [facility]  Medical ICU [phone]

## 2019-09-27 NOTE — ED Provider Notes (Signed)
[name] EMERGENCY DEPARTMENT Provider Note   CSN: [id number] Arrival date & time: 09/27/19  7169     History Chief Complaint  Patient presents with  . Weakness    Paul Brown is a 29 y.o. male.  HPI    Patient presents concern of weakness, cramps, generalized sense of being unwell.  Patient notes history of recent incarceration.  He notes that prior to that.  He was feeling unwell, and attributes his illness to Covid and [place] Nile virus.  He notes that throughout his incarceration, and now after release, he continues to feel weaker than he presumes he should feel, with crampiness in both hamstrings.  He denies other focal pain, confusion, fever. He states that he did receive his coronavirus vaccines.  Patient states that he is currently homeless, but would like to go to [place], where he has personal business to attend to.  Past Medical History:  Diagnosis Date  . Anxiety depression  . Bipolar 1 disorder ([place])   . Depression   . Mood disorder ([place]) anxiety    Patient Active Problem List   Diagnosis Date Noted  . Cannabis use disorder, moderate, dependence ([place]) 06/23/2014  . Schizoaffective disorder, bipolar type ([place]) 06/22/2014    History reviewed. No pertinent surgical history.     No family history on file.  Social History   Tobacco Use  . Smoking status: Current Every Day Smoker    Packs/day: 0.50  . Smokeless tobacco: Never Used  Vaping Use  . Vaping Use: Never assessed  Substance Use Topics  . Alcohol use: No  . Drug use: Yes    Types: Marijuana    Comment: states that he last used last month    Home Medications Prior to Admission medications   Medication Sig Start Date End Date Taking? Authorizing Provider  carbamazepine (TEGRETOL) 100 MG chewable tablet 2 bid Patient taking differently: Chew 100 mg by mouth 2 (two) times daily. 2 bid 03/23/19  Yes Johnn Hai, MD  cetirizine (ZYRTEC) 10 MG tablet Take 10 mg by  mouth daily as needed for allergies.   Yes [provider]  hydrOXYzine (ATARAX/VISTARIL) 25 MG tablet Take 1 tablet (25 mg total) by mouth 3 (three) times daily as needed for anxiety. 03/09/19  Yes Lindell Spar I, NP  OLANZapine zydis (ZYPREXA) 15 MG disintegrating tablet Take 1 tablet (15 mg total) by mouth at bedtime. Patient taking differently: Take 15 mg by mouth at bedtime as needed (sleep).  03/23/19  Yes Johnn Hai, MD  Oxcarbazepine (TRILEPTAL) 300 MG tablet Take 300 mg by mouth 2 (two) times daily.   Yes [provider]  traZODone (DESYREL) 100 MG tablet Take 1 tablet (100 mg total) by mouth at bedtime as needed for sleep. 03/23/19  Yes Johnn Hai, MD    Allergies    Patient has no known allergies.  Review of Systems   Review of Systems  Constitutional:       Per HPI, otherwise negative  HENT:       Per HPI, otherwise negative  Respiratory:       Per HPI, otherwise negative  Cardiovascular:       Per HPI, otherwise negative  Gastrointestinal: Negative for vomiting.  Endocrine:       Negative aside from HPI  Genitourinary:       Neg aside from HPI   Musculoskeletal:       Per HPI, otherwise negative  Skin: Negative for rash.  Neurological: Positive for weakness. Negative for syncope.  Psychiatric/Behavioral: Positive for decreased concentration.    Physical Exam Updated Vital Signs BP 114/70 (BP Location: Left Arm)   Pulse 78   Temp 97.8 F (36.6 C) (Oral)   Resp 16   SpO2 100%   Physical Exam Vitals and nursing note reviewed.  Constitutional:      General: He is not in acute distress.    Appearance: He is well-developed. He is not ill-appearing, toxic-appearing or diaphoretic.     Comments: Thin adult male fidgety, but in no distress  HENT:     Head: Normocephalic and atraumatic.  Eyes:     Conjunctiva/sclera: Conjunctivae normal.  Cardiovascular:     Rate and Rhythm: Normal rate and regular rhythm.  Pulmonary:     Effort:  Pulmonary effort is normal. No respiratory distress.     Breath sounds: No stridor.  Abdominal:     General: There is no distension.  Skin:    General: Skin is warm and dry.  Neurological:     Mental Status: He is alert and oriented to person, place, and time.  Psychiatric:     Comments: Distracted affect     ED Results / Procedures / Treatments   Labs (all labs ordered are listed, but only abnormal results are displayed) Labs Reviewed  CBC - Abnormal; Notable for the following components:      Result Value   RBC 3.95 (*)    Hemoglobin 12.1 (*)    HCT 37.7 (*)    All other components within normal limits  BASIC METABOLIC PANEL  URINALYSIS, ROUTINE W REFLEX MICROSCOPIC  CBG MONITORING, ED    EKG EKG Interpretation  Date/Time:  Tuesday September 27 2019 03:48:44 EDT Ventricular Rate:  63 PR Interval:  122 QRS Duration: 84 QT Interval:  384 QTC Calculation: 392 R Axis:   80 Text Interpretation: Normal sinus rhythm Normal ECG BEL noted, no STEMI Confirmed by Alvester Chou [phone]) on 09/27/2019 7:30:47 AM   Radiology No results found.  Procedures Procedures (including critical care time)  Medications Ordered in ED Medications  sodium chloride flush (NS) 0.9 % injection 3 mL (has no administration in time range)    ED Course  I have reviewed the triage vital signs and the nursing notes.  Pertinent labs & imaging results that were available during my care of the patient were reviewed by me and considered in my medical decision making (see chart for details).  Young male with chart history suggesting psychiatric disorder presents with concern for mild weakness, bilateral hamstring cramps.  Patient is awake, alert, denies any other focal pain, denies other new complaints, and given his reassuring vital signs, physical exam, labs, low suspicion for acute systemic pathology.  Some suspicion for the patient's psychiatric disease contributing to his perseverates on illness,  without evidence for decompensated state of [place], or Covid. With reassuring findings here the patient appropriate for discharge, after I discussed his case with our social work colleagues for assistance given his recent incarceration. Final Clinical Impression(s) / ED Diagnoses Final diagnoses:  Weakness      Gerhard Munch, MD 09/27/19 1652

## 2019-09-27 NOTE — Discharge Instructions (Addendum)
As discussed, your evaluation today has been largely reassuring.  But, it is important that you monitor your condition carefully, and do not hesitate to return to the ED if you develop new, or concerning changes in your condition. ? ?Otherwise, please follow-up with your physician for appropriate ongoing care. ? ?

## 2019-09-27 NOTE — ED Notes (Signed)
Pt discharge instructions reviewed with the patient. The patient verbalized understanding of instructions. Pt discharged. 

## 2020-09-10 IMAGING — CR DG CHEST 2V
2 series · 2 of 2 positions shown · non-contrast
Comparison: None.

CLINICAL DATA: Pt presents with c/o some chest pain and SOB.
Smoker.

EXAM:
CHEST - 2 VIEW

[w chest pa]
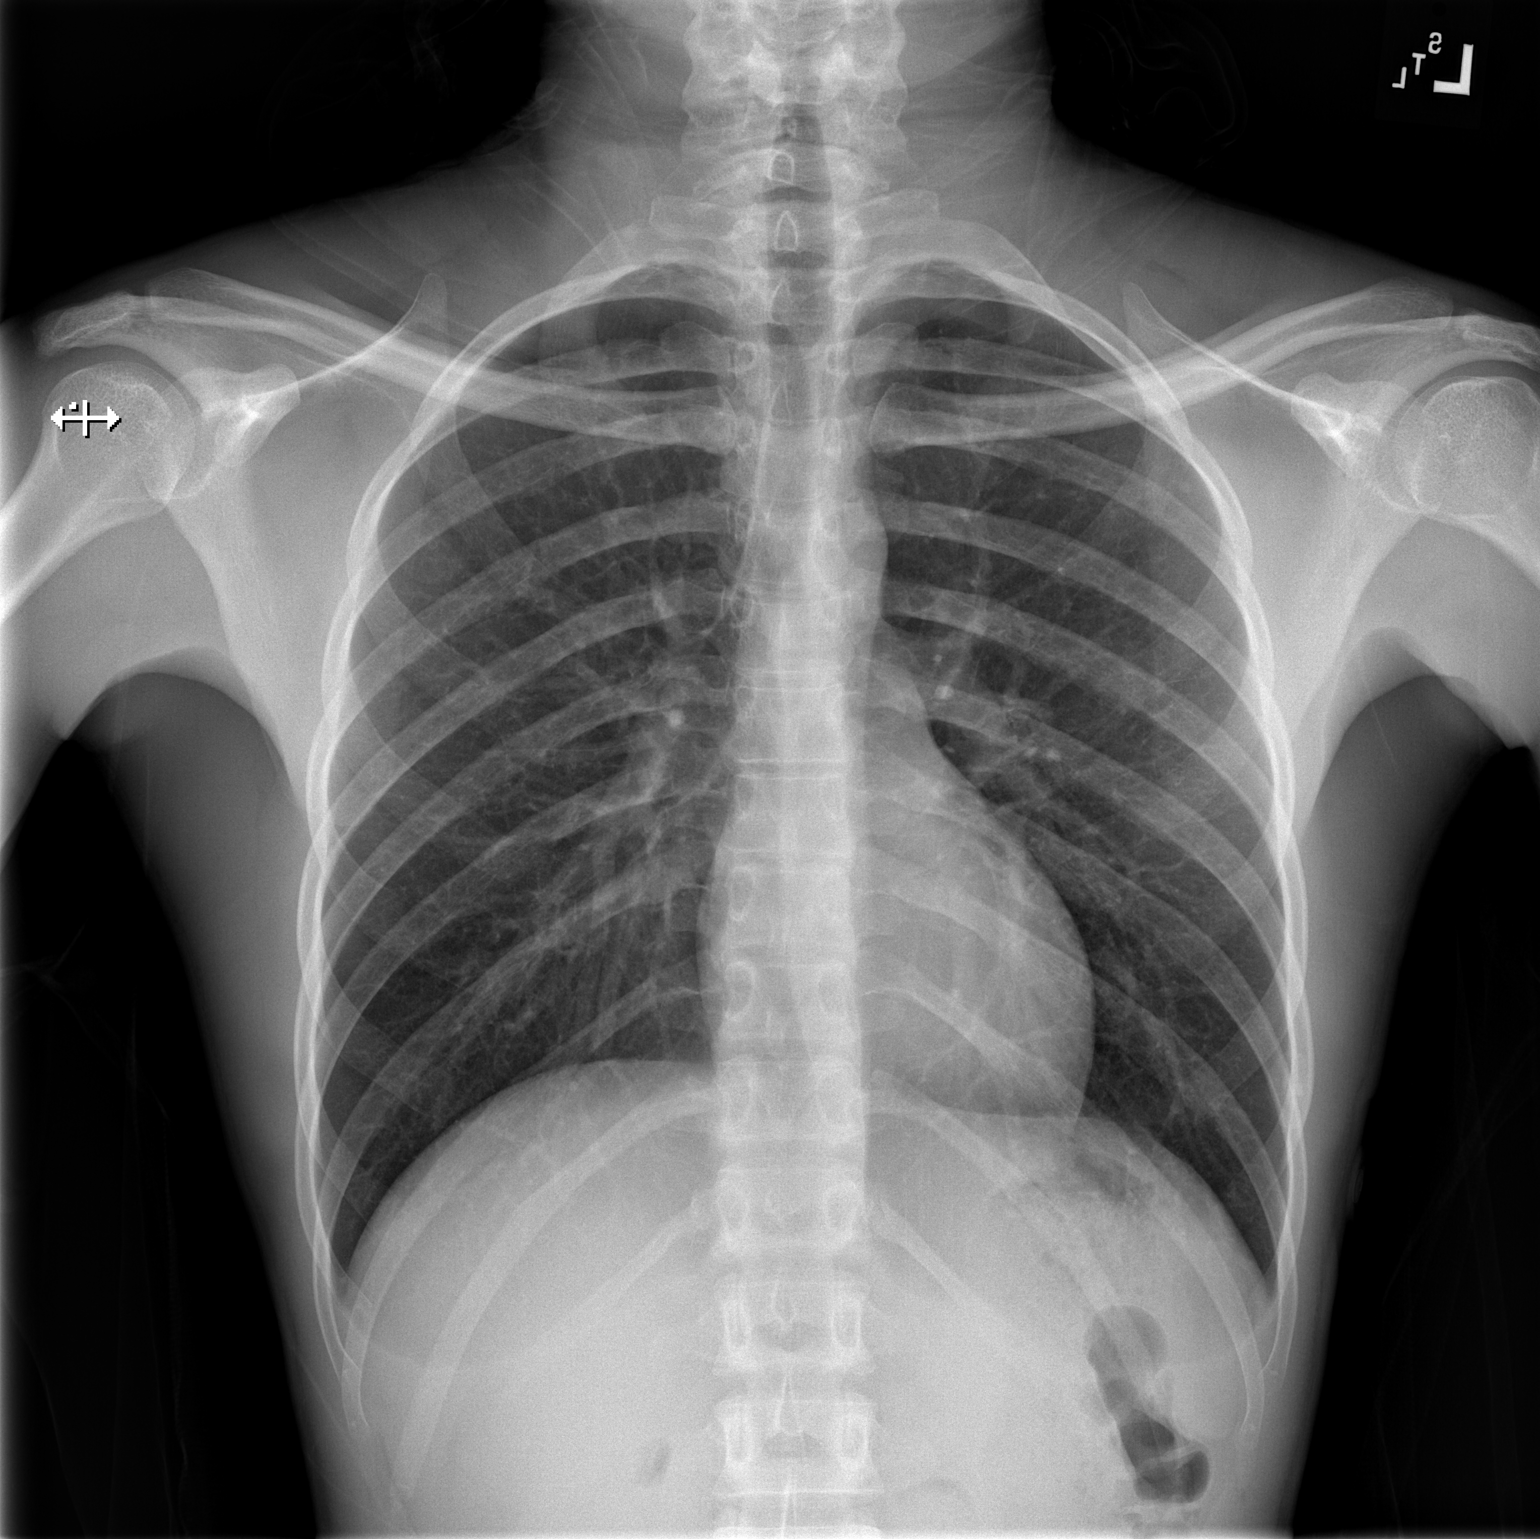

[w chest lat]
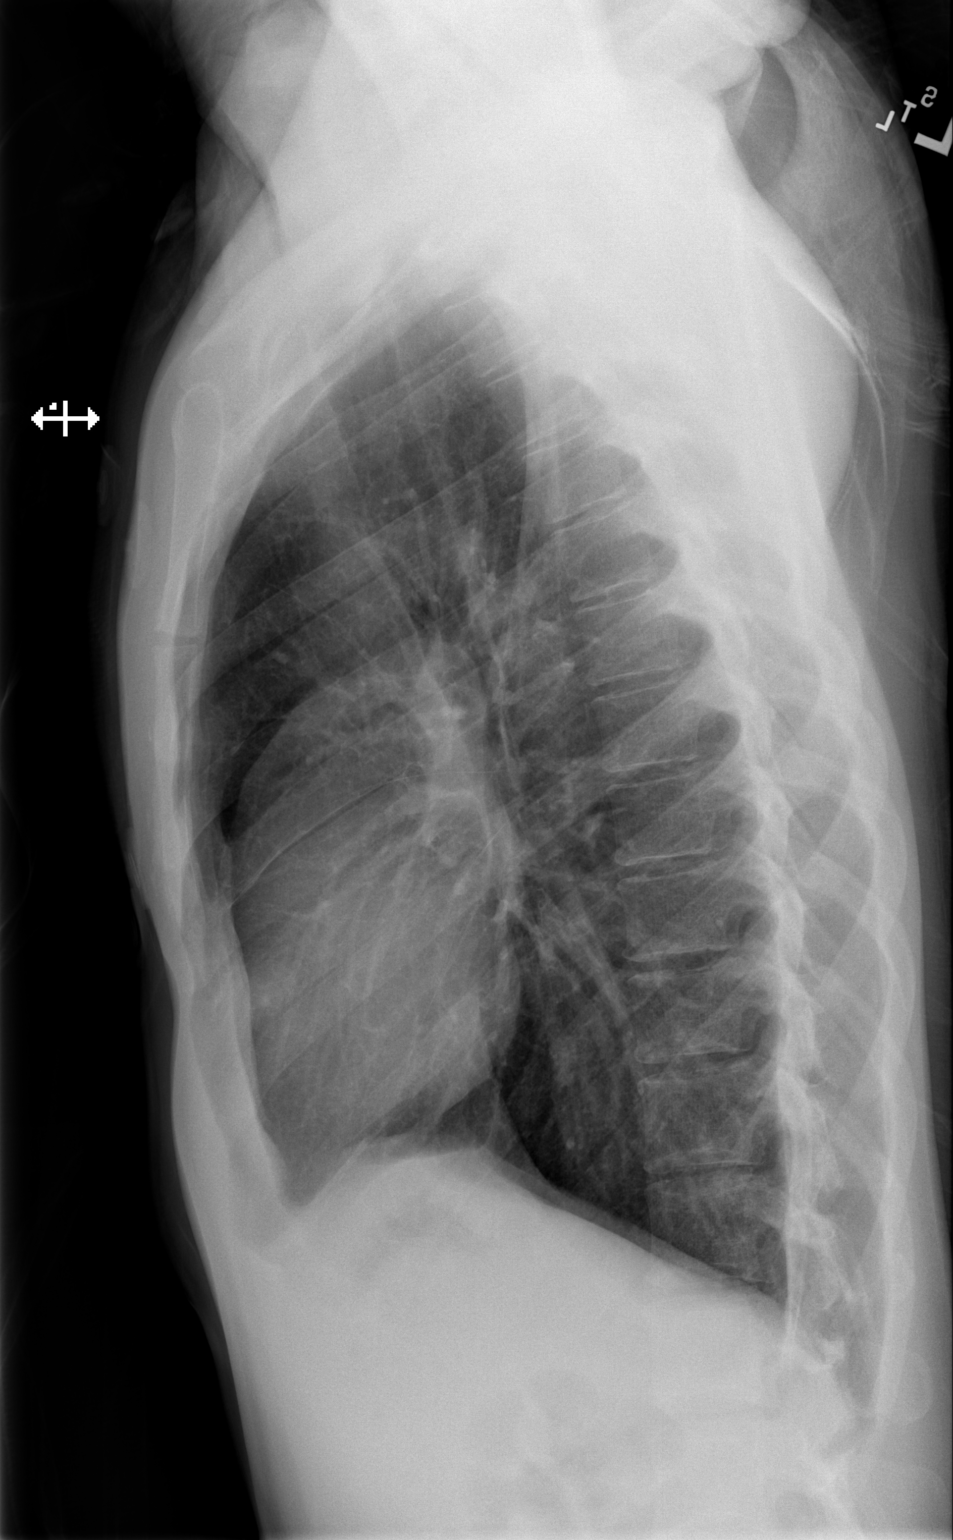

[2 of 2 positions shown; findings below may reference images not displayed]

FINDINGS: The heart size and mediastinal contours are within normal limits.
The lungs are clear. No pneumothorax or pleural effusion. The
visualized skeletal structures are unremarkable.
IMPRESSION: No acute cardiopulmonary process.

## 2020-09-11 ENCOUNTER — Emergency Department (HOSPITAL_COMMUNITY): Payer: Self-pay

## 2020-09-11 ENCOUNTER — Emergency Department (HOSPITAL_COMMUNITY)
Admission: EM | Admit: 2020-09-11 | Discharge: 2020-09-11 | Disposition: A | Discharge status: Home / Self Care | Payer: Self-pay | Attending: Emergency Medicine | Admitting: Emergency Medicine

## 2020-09-11 ENCOUNTER — Other Ambulatory Visit: Payer: Self-pay

## 2020-09-11 DIAGNOSIS — R079 Chest pain, unspecified: Secondary | ICD-10-CM | POA: Insufficient documentation

## 2020-09-11 DIAGNOSIS — M791 Myalgia, unspecified site: Secondary | ICD-10-CM | POA: Insufficient documentation

## 2020-09-11 DIAGNOSIS — F17200 Nicotine dependence, unspecified, uncomplicated: Secondary | ICD-10-CM | POA: Insufficient documentation

## 2020-09-11 DIAGNOSIS — R52 Pain, unspecified: Secondary | ICD-10-CM

## 2020-09-11 LAB — URINALYSIS, ROUTINE W REFLEX MICROSCOPIC
Bilirubin Urine: NEGATIVE
Glucose, UA: NEGATIVE mg/dL
Hgb urine dipstick: NEGATIVE
Ketones, ur: NEGATIVE mg/dL
Leukocytes,Ua: NEGATIVE
Nitrite: NEGATIVE
Protein, ur: NEGATIVE mg/dL
Specific Gravity, Urine: 1.01 (ref 1.005–1.030)
pH: 7 (ref 5.0–8.0)

## 2020-09-11 LAB — I-STAT CHEM 8, ED
BUN: 10 mg/dL (ref 6–20)
Calcium, Ion: 1.24 mmol/L (ref 1.15–1.40)
Chloride: 100 mmol/L (ref 98–111)
Creatinine, Ser: 0.9 mg/dL (ref 0.61–1.24)
Glucose, Bld: 97 mg/dL (ref 70–99)
HCT: 43 % (ref 39.0–52.0)
Hemoglobin: 14.6 g/dL (ref 13.0–17.0)
Potassium: 3.7 mmol/L (ref 3.5–5.1)
Sodium: 140 mmol/L (ref 135–145)
TCO2: 28 mmol/L (ref 22–32)

## 2020-09-11 NOTE — ED Notes (Signed)
Pt given snack and drink per verbal ok from attending provider. Resting comfortably, denies other needs at this time.

## 2020-09-11 NOTE — ED Triage Notes (Signed)
Pt reports groin, hip, and bilateral knee pain x 1 month.

## 2020-09-11 NOTE — ED Provider Notes (Signed)
Emergency Medicine Provider Triage Evaluation Note  Paul Brown , a 30 y.o. male  was evaluated in triage.  Pt complains of multiple complaints. Primarily is testicular pain. It is intermittent, has been happening for 1 month. No discharge from penis. Some dysuria without hematuria.   Review of Systems  Positive: Testicular pain, dysuria, nausea, chest pain, abdominal pain, headache  Negative: Penile discharge   Physical Exam  BP 136/87 (BP Location: Right Arm)   Pulse 82   Temp 98.9 F (37.2 C) (Oral)   Resp 14   SpO2 100%  Gen:   Awake, no distress   Resp:  Normal effort  MSK:   Moves extremities without difficulty  Other:  S1 S2  Medical Decision Making  Medically screening exam initiated at 10:20 AM.  Appropriate orders placed.  Paul Brown was informed that the remainder of the evaluation will be completed by another provider, this initial triage assessment does not replace that evaluation, and the importance of remaining in the ED until their evaluation is complete.  He has multiple complaints but primary appears to be intermittent scrotal pain. Will assess for torsion and UTI   Theron Arista, Cordelia Poche 09/11/20 1022    Arby Barrette, MD 09/13/20 2352

## 2020-09-11 NOTE — Discharge Instructions (Addendum)
Use Tylenol or ibuprofen as needed for generalized body aches. If you're having chest burning when laying flat, this may be heartburn.  Avoid spicy, greasy, acidic foods.  Decrease the amount of sodas you are drinking.  Do not eat large meals and lay flat right away, as this can increase symptoms. Follow-up with the [facility] listed below for further evaluation of the symptoms Return to the emergency room with any new, worsening, concerning symptoms.

## 2020-09-11 NOTE — ED Provider Notes (Signed)
[facility] EMERGENCY DEPARTMENT Provider Note   CSN: [id number] Arrival date & time: 09/11/20  1014     History No chief complaint on file.   Paul Brown is a 30 y.o. male presenting for evaluation of intermittent generalized body aches and chest burning.  Patient states that the past month, he has had intermittent generalized body aches.  He describes it as a soreness that is worse when he is standing for long period of time.  He also states when he lays down he sometimes has pain in his hips and his back.  When he lays flat, he occasionally has burning in his chest.  No burning when he is sitting upright.  Symptoms are not worsening, but they are not improving which is what brought him to the ER today.  He denies change in activity.  No change in diet.  He states that when he drinks lots of sodas, he has worsened symptoms.  He has not had this evaluated before.  He denies fevers, cough, current chest pain, shortness of breath, nausea, vomiting, abdominal pain, urinary symptoms, abnormal bowel movements. Patient states his symptoms began when he was discharged from the psychiatric hospital about a month ago.  Additional history obtained from chart review.  Patient with a history of bipolar, anxiety, schizoaffective.  Has been seen in the ER for vague generalized complaints in the past, thought to be related to his psychiatric condition.  HPI     Past Medical History:  Diagnosis Date  . Anxiety depression  . Bipolar 1 disorder (HCC)   . Depression   . Mood disorder (HCC) anxiety    Patient Active Problem List   Diagnosis Date Noted  . Cannabis use disorder, moderate, dependence (HCC) 06/23/2014  . Schizoaffective disorder, bipolar type (HCC) 06/22/2014    No past surgical history on file.     No family history on file.  Social History   Tobacco Use  . Smoking status: Current Every Day Smoker    Packs/day: 0.50  . Smokeless tobacco: Never Used   Substance Use Topics  . Alcohol use: No  . Drug use: Yes    Types: Marijuana    Comment: states that he last used last month    Home Medications Prior to Admission medications   Medication Sig Start Date End Date Taking? Authorizing Provider  carbamazepine (TEGRETOL) 100 MG chewable tablet 2 bid Patient taking differently: Chew 100 mg by mouth 2 (two) times daily. 2 bid 03/23/19   Malvin Johns, MD  cetirizine (ZYRTEC) 10 MG tablet Take 10 mg by mouth daily as needed for allergies.    [provider]  hydrOXYzine (ATARAX/VISTARIL) 25 MG tablet Take 1 tablet (25 mg total) by mouth 3 (three) times daily as needed for anxiety. 03/09/19   Armandina Stammer I, NP  OLANZapine zydis (ZYPREXA) 15 MG disintegrating tablet Take 1 tablet (15 mg total) by mouth at bedtime. Patient taking differently: Take 15 mg by mouth at bedtime as needed (sleep).  03/23/19   Malvin Johns, MD  Oxcarbazepine (TRILEPTAL) 300 MG tablet Take 300 mg by mouth 2 (two) times daily.    [provider]  traZODone (DESYREL) 100 MG tablet Take 1 tablet (100 mg total) by mouth at bedtime as needed for sleep. 03/23/19   Malvin Johns, MD    Allergies    Patient has no known allergies.  Review of Systems   Review of Systems  Cardiovascular: Chest pain: chest burning when laying flat,  intermittent.  Musculoskeletal: Positive for myalgias (intermittent).  All other systems reviewed and are negative.   Physical Exam Updated Vital Signs BP 132/78   Pulse 67   Temp 98.7 F (37.1 C) (Oral)   Resp 17   Ht 5\' 5"  (1.651 m)   Wt 60 kg   SpO2 99%   BMI 22.01 kg/m   Physical Exam Vitals and nursing note reviewed.  Constitutional:      General: He is not in acute distress.    Appearance: He is well-developed.     Comments: Resting in the bed in NAD  HENT:     Head: Normocephalic and atraumatic.  Eyes:     Conjunctiva/sclera: Conjunctivae normal.     Pupils: Pupils are equal, round, and reactive to light.   Cardiovascular:     Rate and Rhythm: Normal rate and regular rhythm.  Pulmonary:     Effort: Pulmonary effort is normal. No respiratory distress.     Breath sounds: Normal breath sounds. No wheezing.  Abdominal:     General: There is no distension.     Palpations: Abdomen is soft. There is no mass.     Tenderness: There is no abdominal tenderness. There is no guarding or rebound.  Musculoskeletal:        General: Normal range of motion.     Cervical back: Normal range of motion and neck supple.     Comments: No focal ttp of the knees, hips, or back. Moving all ext easily  Skin:    General: Skin is warm and dry.     Capillary Refill: Capillary refill takes less than 2 seconds.  Neurological:     Mental Status: He is alert and oriented to person, place, and time.     ED Results / Procedures / Treatments   Labs (all labs ordered are listed, but only abnormal results are displayed) Labs Reviewed  URINALYSIS, ROUTINE W REFLEX MICROSCOPIC - Abnormal; Notable for the following components:      Result Value   Color, Urine STRAW (*)    All other components within normal limits  I-STAT CHEM 8, ED    EKG None  Radiology SCROTUM W/DOPPLER  Result Date: 09/11/2020 CLINICAL DATA:  Intermittent left testicular pain EXAM: SCROTAL ULTRASOUND DOPPLER ULTRASOUND OF THE TESTICLES TECHNIQUE: Complete ultrasound examination of the testicles, epididymis, and other scrotal structures was performed. Color and spectral Doppler ultrasound were also utilized to evaluate blood flow to the testicles. COMPARISON:  None. FINDINGS: Right testicle Measurements: 3.6 x 1.5 x 2.6 cm. No mass or microlithiasis visualized. Left testicle Measurements: 3.7 x 1.9 x 2.3 cm. No mass or microlithiasis visualized. Right epididymis:  Normal in size and appearance. Left epididymis:  Small cyst in the head measuring 3 mm. Hydrocele:  None visualized. Varicocele:  Bilateral varicoceles. Pulsed Doppler interrogation of both  testes demonstrates normal low resistance arterial and venous waveforms bilaterally. IMPRESSION: No testicular abnormality or evidence of torsion. Bilateral varicoceles. Electronically Signed   By: 11/11/2020 M.D.   On: 09/11/2020 11:24    Procedures Procedures   Medications Ordered in ED Medications - No data to display  ED Course  I have reviewed the triage vital signs and the nursing notes.  Pertinent labs & imaging results that were available during my care of the patient were reviewed by me and considered in my medical decision making (see chart for details).    MDM Rules/Calculators/A&P  Patient presented for evaluation of intermittent generalized body aches and intermittent burning of the chest when he lays flat.  On exam, patient appears nontoxic.  He has no symptoms currently.  Scrotal ultrasound ordered from triage negative for acute findings.  Urine negative for infection.  In the setting of generalized body aches, will have low suspicion for acute abnormality, will order a Chem-8 to check electrolytes, kidney function, hemoglobin.  EKG ordered for intermittent chest pain, although in the setting of a burning sensation when laying flat, favor heartburn.  Chem-8 negative for acute findings.  EKG nonischemic.  Discussed findings with patient.  Discussed symptomatic management and follow-up with PCP.  Resources given.  At this time, patient appears safe for discharge.  Return precautions given.  Patient states he understands and agrees to plan.  Final Clinical Impression(s) / ED Diagnoses Final diagnoses:  Generalized body aches    Rx / DC Orders ED Discharge Orders    None       Alveria Apley, PA-C 09/11/20 1744    Milagros Loll, MD 09/11/20 2016

## 2020-09-13 IMAGING — DX DG CHEST 1V PORT
1 series · 1 of 1 positions shown · non-contrast
Comparison: 05/21/2019

CLINICAL DATA: Overdose. Patient drank bleach.

EXAM:
PORTABLE CHEST 1 VIEW

[chest ap]
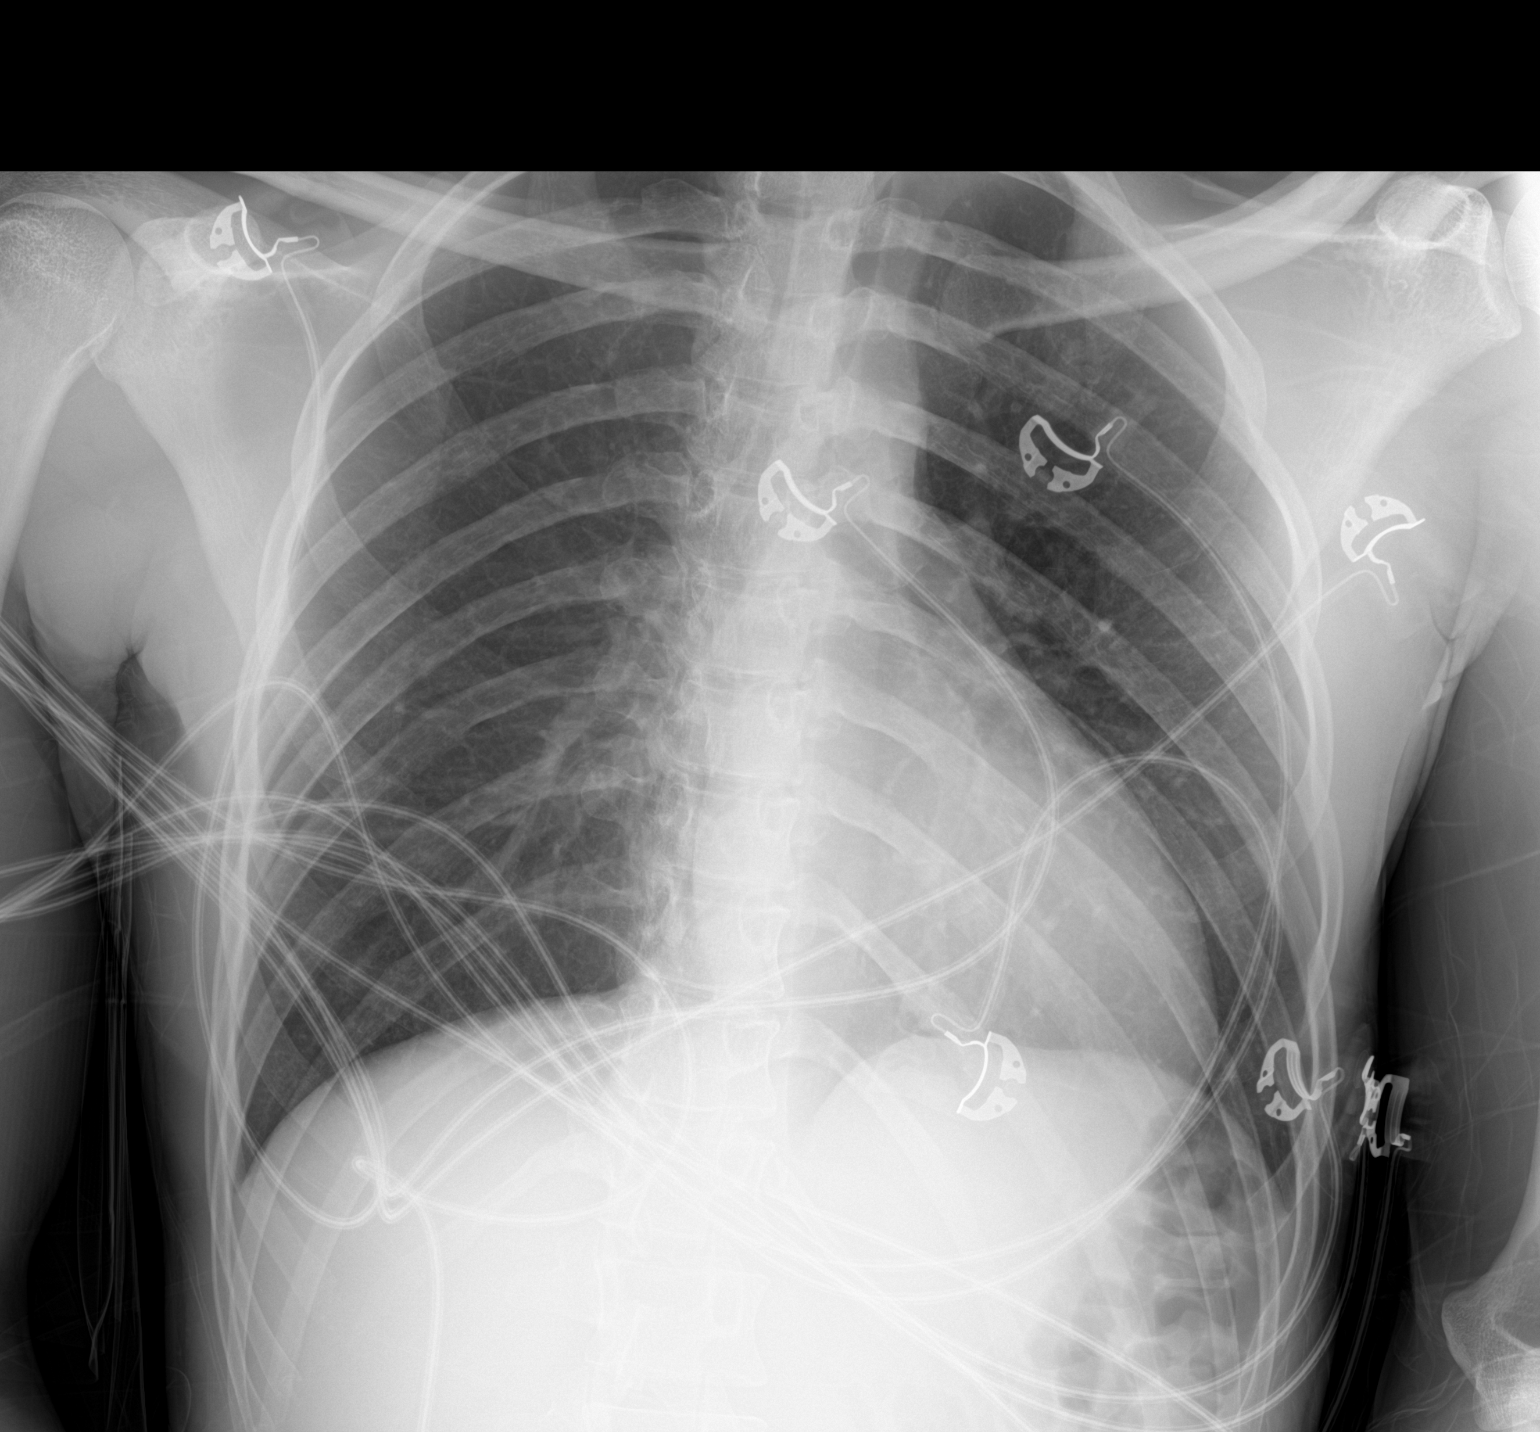

[1 of 1 positions shown; findings below may reference images not displayed]

FINDINGS: Patient is rotated. The cardiomediastinal contours are normal. No
evidence of pneumomediastinum. The lungs are clear. Pulmonary
vasculature is normal. No consolidation, pleural effusion, or
pneumothorax. No acute osseous abnormalities are seen.
IMPRESSION: Rotated but otherwise negative radiograph of the chest.

## 2022-01-02 IMAGING — US US SCROTUM W/ DOPPLER COMPLETE
1 series · 14 of 25 positions shown · non-contrast
Comparison: None.

CLINICAL DATA: Intermittent left testicular pain

EXAM:
SCROTAL ULTRASOUND
DOPPLER ULTRASOUND OF THE TESTICLES
TECHNIQUE: Complete ultrasound examination of the testicles, epididymis, and
other scrotal structures was performed. Color and spectral Doppler
ultrasound were also utilized to evaluate blood flow to the
testicles.

[Series 1: us scrotum w/doppler · 14 of 65 slices shown]
[im 1/65]
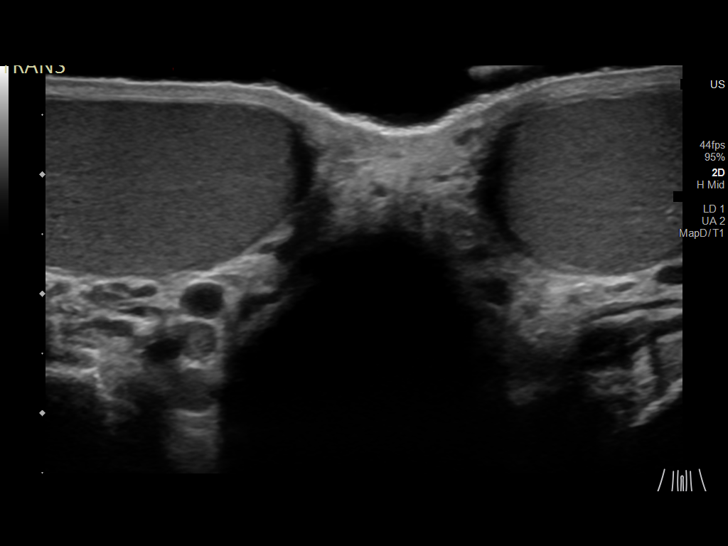
[im 6/65]
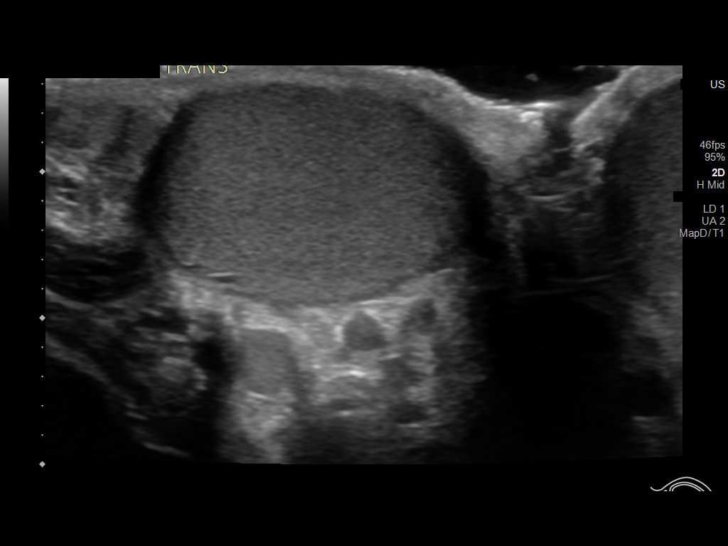
[im 11/65]
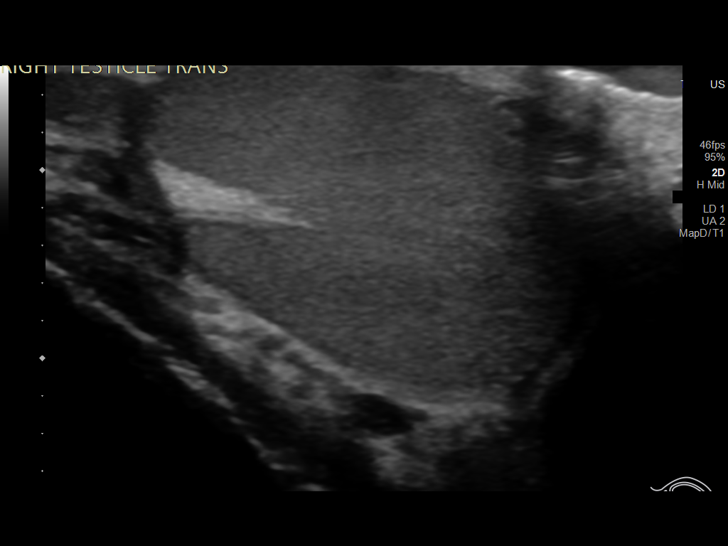
[im 17/65]
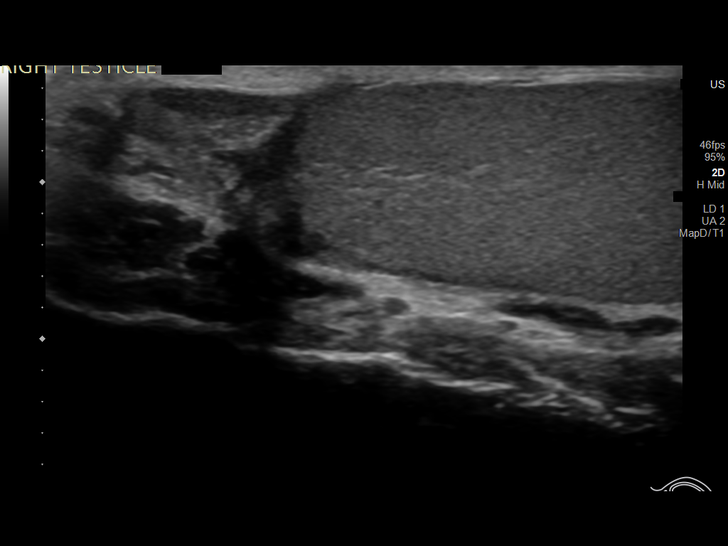
[im 22/65]
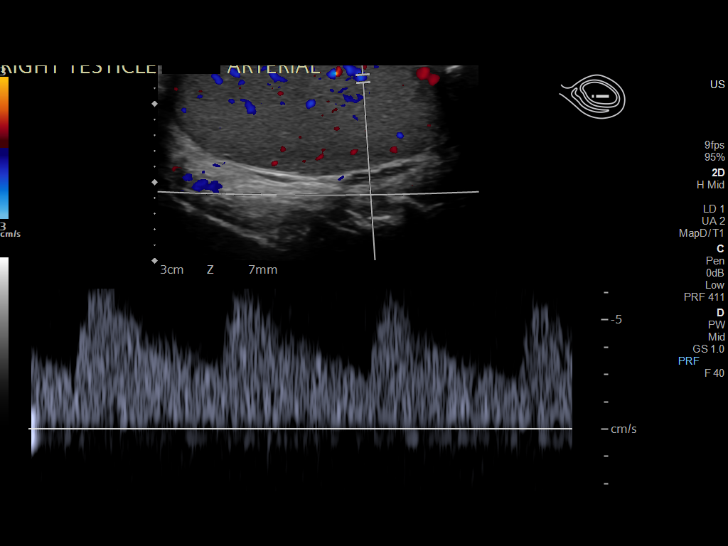
[im 25/65]
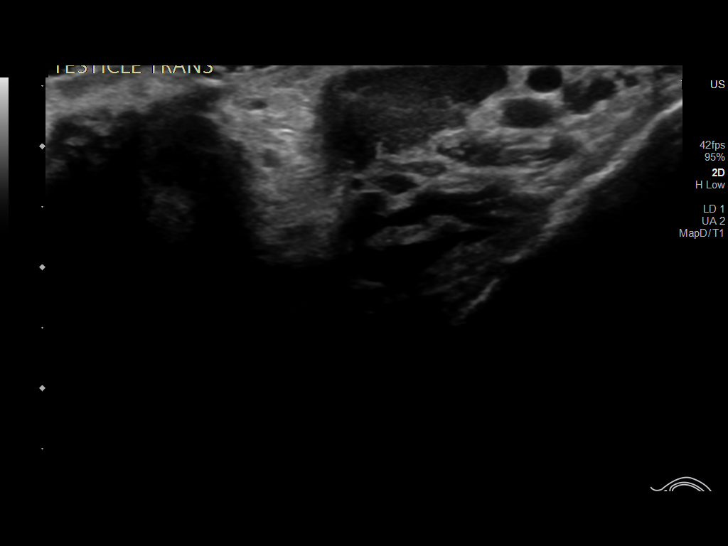
[im 30/65]
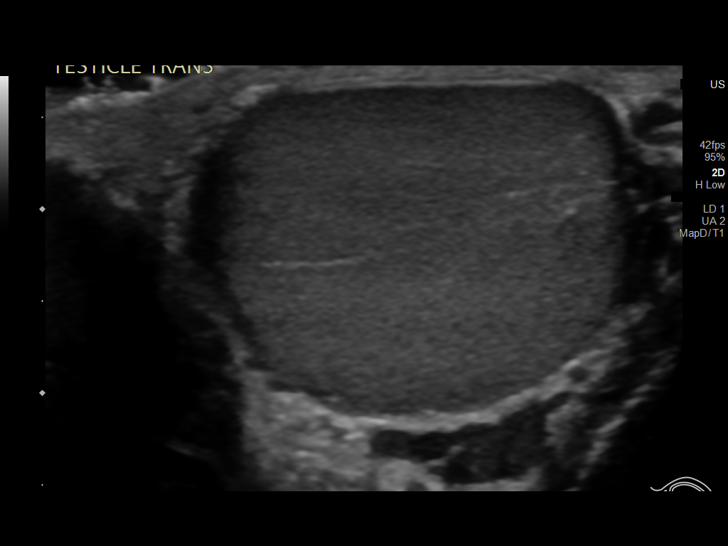
[im 35/65]
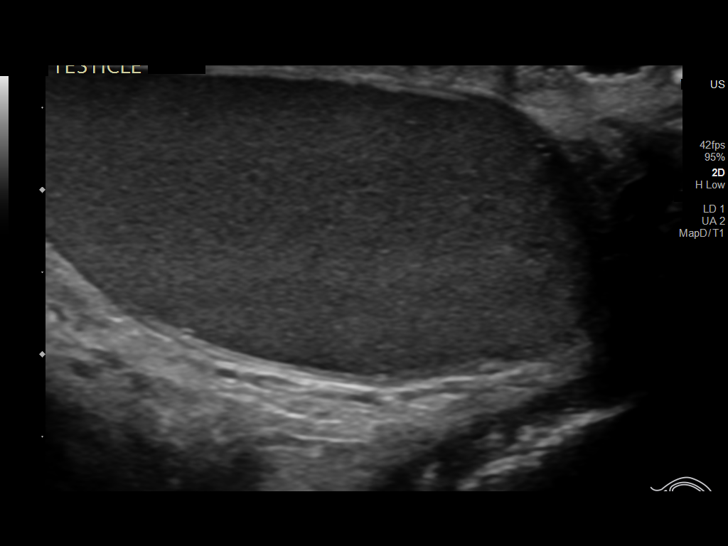
[im 41/65]
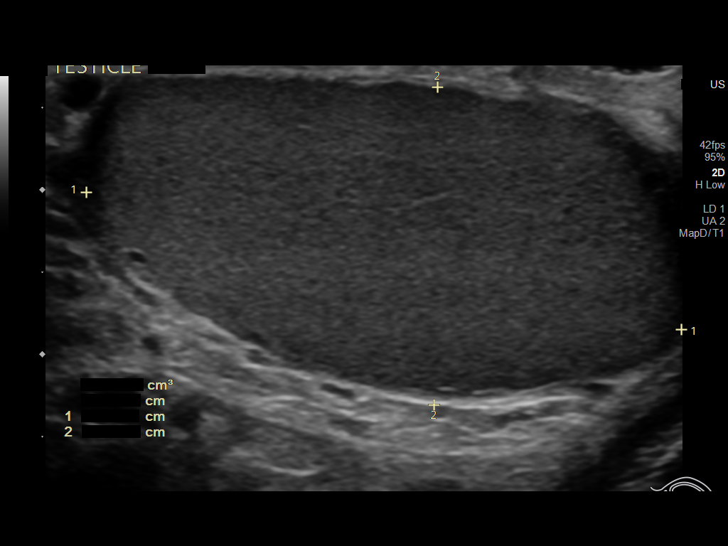
[im 43/65]
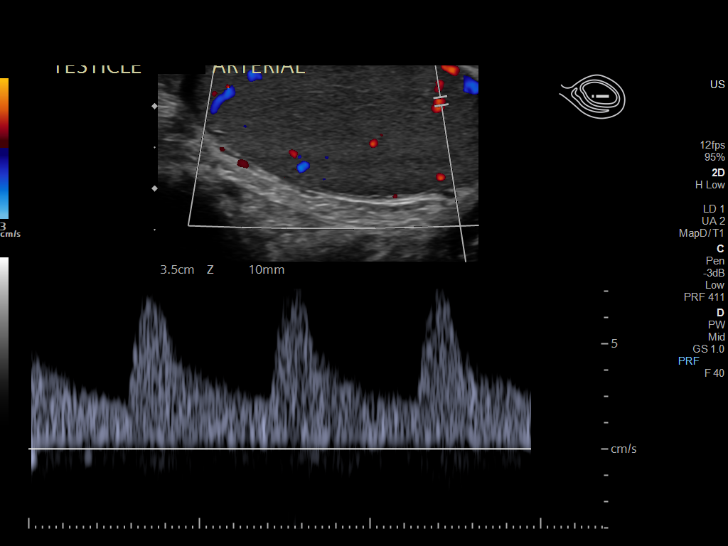
[im 49/65]
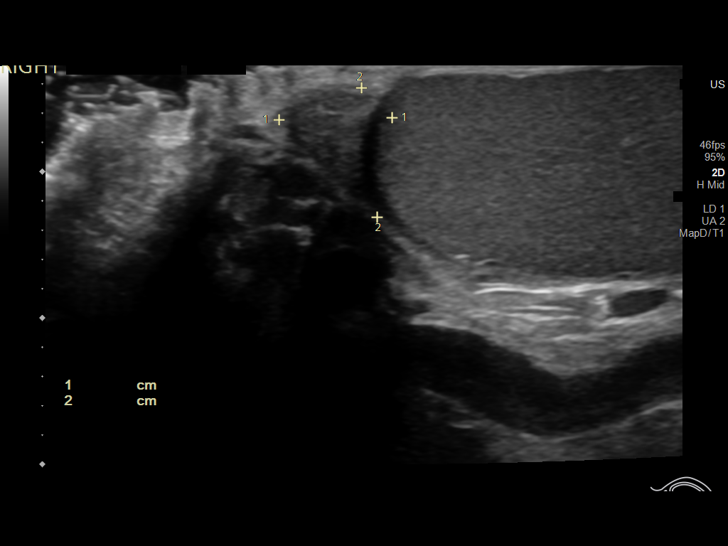
[im 54/65]
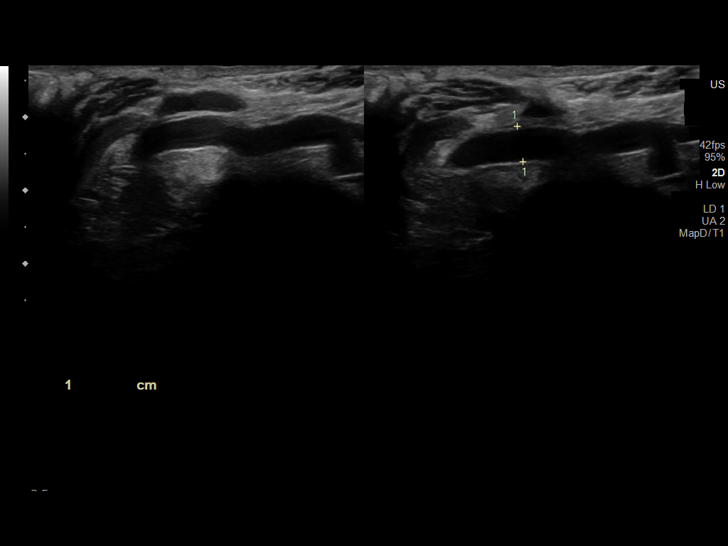
[im 59/65]
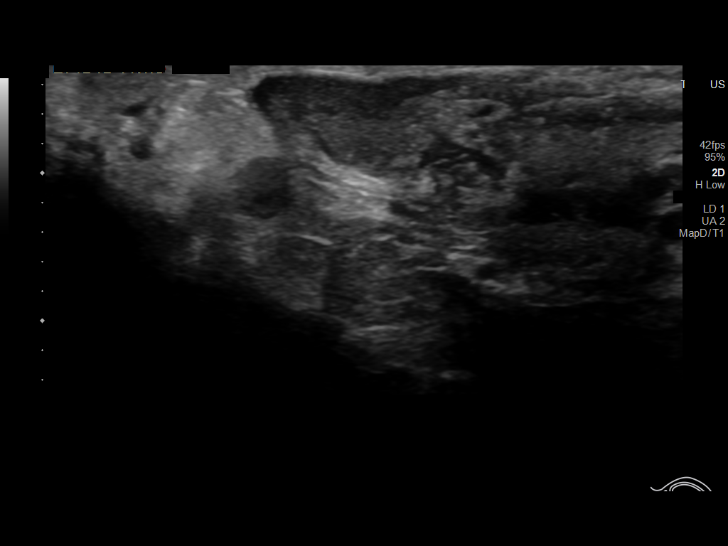
[im 65/65]
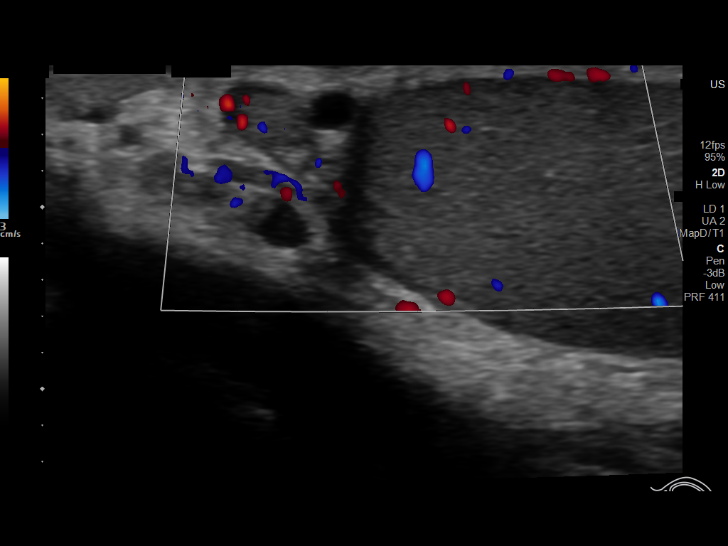

[14 of 25 positions shown; findings below may reference images not displayed]

FINDINGS: Right testicle

Measurements: 3.6 x 1.5 x 2.6 cm. No mass or microlithiasis
visualized.

Left testicle

Measurements: 3.7 x 1.9 x 2.3 cm. No mass or microlithiasis
visualized.

Right epididymis:  Normal in size and appearance.

Left epididymis:  Small cyst in the head measuring 3 mm.

Hydrocele:  None visualized.

Varicocele:  Bilateral varicoceles.

Pulsed Doppler interrogation of both testes demonstrates normal low
resistance arterial and venous waveforms bilaterally.
IMPRESSION: No testicular abnormality or evidence of torsion.

Bilateral varicoceles.
